# Patient Record
Sex: Female | Born: 1995
Health system: Southern US, Community
[De-identification: ages and names within clinical notes are randomized; demographics above are authoritative.]

## PROBLEM LIST (undated history)

## (undated) ENCOUNTER — Inpatient Hospital Stay (HOSPITAL_COMMUNITY): Payer: Self-pay

## (undated) DIAGNOSIS — Z789 Other specified health status: Secondary | ICD-10-CM

---
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
## 2017-06-04 ENCOUNTER — Encounter (HOSPITAL_COMMUNITY): Payer: Self-pay | Admitting: Emergency Medicine

## 2017-06-04 ENCOUNTER — Ambulatory Visit (HOSPITAL_COMMUNITY)
Admission: EM | Admit: 2017-06-04 | Discharge: 2017-06-04 | Disposition: A | Discharge status: Home / Self Care | Payer: Self-pay | Attending: Family Medicine | Admitting: Family Medicine

## 2017-06-04 DIAGNOSIS — N898 Other specified noninflammatory disorders of vagina: Secondary | ICD-10-CM | POA: Insufficient documentation

## 2017-06-04 DIAGNOSIS — N9089 Other specified noninflammatory disorders of vulva and perineum: Secondary | ICD-10-CM

## 2017-06-04 DIAGNOSIS — R102 Pelvic and perineal pain: Secondary | ICD-10-CM | POA: Insufficient documentation

## 2017-06-04 DIAGNOSIS — Z79899 Other long term (current) drug therapy: Secondary | ICD-10-CM | POA: Insufficient documentation

## 2017-06-04 DIAGNOSIS — R300 Dysuria: Secondary | ICD-10-CM | POA: Insufficient documentation

## 2017-06-04 DIAGNOSIS — Z3202 Encounter for pregnancy test, result negative: Secondary | ICD-10-CM

## 2017-06-04 LAB — POCT URINALYSIS DIP (DEVICE)
BILIRUBIN URINE: NEGATIVE
GLUCOSE, UA: NEGATIVE mg/dL
Ketones, ur: NEGATIVE mg/dL
Nitrite: NEGATIVE
Protein, ur: NEGATIVE mg/dL
SPECIFIC GRAVITY, URINE: 1.025 (ref 1.005–1.030)
Urobilinogen, UA: 0.2 mg/dL (ref 0.0–1.0)
pH: 5.5 (ref 5.0–8.0)

## 2017-06-04 LAB — POCT PREGNANCY, URINE: Preg Test, Ur: NEGATIVE

## 2017-06-04 MED ORDER — MUPIROCIN 2 % EX OINT: 1.0000 "application " | TOPICAL_OINTMENT | Freq: Two times a day (BID) | CUTANEOUS | 0 refills | Status: DC

## 2017-06-04 NOTE — ED Triage Notes (Signed)
Pt sts vaginal irritation since having intercourse 3 days ago

## 2017-06-04 NOTE — ED Provider Notes (Signed)
[facility]    CSN: [id number] Arrival date & time: 06/04/17  1516     History   Chief Complaint Chief Complaint  Patient presents with  . Vaginal Discharge    HPI Pam Jacobson is a 22 y.o. female.   22 year old female comes in for 1 week history of vaginal irritation and pain.  Patient states that started after intercourse, and worries about a tear. She is noticed some vaginal discharge, denies itching.  She states that discharge can be brown in nature, and wonders if her cycle is about to start.  LMP 05/10/2017.  She had some abdominal pain in this past week that resolved on its own.  Denies current abdominal pain, denies nausea, vomiting.  Dysuria without frequency, urgency, hematuria.  Denies fever, chills, night sweats.  Sexually active with one female partner, no condom use.  No birth control use.      History reviewed. No pertinent past medical history.  There are no active problems to display for this patient.   History reviewed. No pertinent surgical history.  OB History    No data available       Home Medications    Prior to Admission medications   Medication Sig Start Date End Date Taking? Authorizing Provider  amoxicillin (AMOXIL) 500 MG capsule Take 1 capsule (500 mg total) by mouth 3 (three) times daily. 11/19/16   Elson AreasSofia, Leslie K, PA-C  mupirocin ointment (BACTROBAN) 2 % Apply 1 application topically 2 (two) times daily. 06/04/17   Belinda FisherYu, Abimbola Aki V, PA-C    Family History History reviewed. No pertinent family history.  Social History Social History   Tobacco Use  . Smoking status: Not on file  Substance Use Topics  . Alcohol use: Not on file  . Drug use: Not on file     Allergies   Patient has no known allergies.   Review of Systems Review of Systems  Reason unable to perform ROS: See HPI as above.     Physical Exam Triage Vital Signs ED Triage Vitals [06/04/17 1543]  Enc Vitals Group     BP 121/72     Pulse Rate 67   Resp 18     Temp 99 F (37.2 C)     Temp Source Oral     SpO2 96 %     Weight      Height      Head Circumference      Peak Flow      Pain Score      Pain Loc      Pain Edu?      Excl. in GC?    No data found.  Updated Vital Signs BP 121/72 (BP Location: Right Arm)   Pulse 67   Temp 99 F (37.2 C) (Oral)   Resp 18   SpO2 96%   Physical Exam  Constitutional: She is oriented to person, place, and time. She appears well-developed and well-nourished. No distress.  HENT:  Head: Normocephalic and atraumatic.  Eyes: Conjunctivae are normal. Pupils are equal, round, and reactive to light.  Cardiovascular: Normal rate, regular rhythm and normal heart sounds. Exam reveals no gallop and no friction rub.  No murmur heard. Pulmonary/Chest: Effort normal and breath sounds normal. She has no wheezes. She has no rales.  Abdominal: Soft. Bowel sounds are normal. She exhibits no mass. There is no tenderness. There is no rebound, no guarding and no CVA tenderness.  Genitourinary:     Genitourinary Comments: Small  tear of the vulva at 6 o'clock without swelling, erythema, increased warmth. Patient unable to tolerate speculum exam due to tear. Vaginal swab obtained for cytology.  No other lesions seen, wound appearance inconsistent with HSV.   Neurological: She is alert and oriented to person, place, and time.  Skin: Skin is warm and dry.  Psychiatric: She has a normal mood and affect. Her behavior is normal. Judgment normal.     UC Treatments / Results  Labs (all labs ordered are listed, but only abnormal results are displayed) Labs Reviewed  POCT URINALYSIS DIP (DEVICE) - Abnormal; Notable for the following components:      Result Value   Hgb urine dipstick MODERATE (*)    Leukocytes, UA SMALL (*)    All other components within normal limits  URINE CULTURE  POCT PREGNANCY, URINE  CERVICOVAGINAL ANCILLARY ONLY    EKG  EKG Interpretation None       Radiology No results  found.  Procedures Procedures (including critical care time)  Medications Ordered in UC Medications - No data to display   Initial Impression / Assessment and Plan / UC Course  I have reviewed the triage vital signs and the nursing notes.  Pertinent labs & imaging results that were available during my care of the patient were reviewed by me and considered in my medical decision making (see chart for details).    Small tear at the 6:00 region of the vulva.  No signs of infection.  Will have patient use Bactroban as needed.  Keep area clean and dry.  Cytology sent.  Urine culture sent.  Return precautions given.  Patient expresses understanding and agrees to plan.  Final Clinical Impressions(s) / UC Diagnoses   Final diagnoses:  Vulvar pain  Vaginal discharge    ED Discharge Orders        Ordered    mupirocin ointment (BACTROBAN) 2 %  2 times daily     06/04/17 1619        Belinda Fisher, PA-C 06/04/17 1623

## 2017-06-04 NOTE — Discharge Instructions (Signed)
You have a small tear at the vulva.  He can apply Bactroban ointment to the affected area.  Keep area clean and dry.  Refrain from any sexual activity until area is healed.  Testing sent, you will be contacted with any positive results.  Any medications needed will be sent and then. Monitor for any worsening of symptoms, fever, abdominal pain, nausea, vomiting, to follow up for reevaluation.

## 2017-06-05 LAB — CERVICOVAGINAL ANCILLARY ONLY
BACTERIAL VAGINITIS: POSITIVE — AB
Candida vaginitis: NEGATIVE
Chlamydia: NEGATIVE
Neisseria Gonorrhea: NEGATIVE
Trichomonas: NEGATIVE

## 2017-06-05 LAB — URINE CULTURE: Culture: NO GROWTH

## 2018-06-14 ENCOUNTER — Emergency Department (HOSPITAL_BASED_OUTPATIENT_CLINIC_OR_DEPARTMENT_OTHER)
Admission: EM | Admit: 2018-06-14 | Discharge: 2018-06-14 | Disposition: A | Discharge status: Home / Self Care | Payer: Self-pay | Attending: Emergency Medicine | Admitting: Emergency Medicine

## 2018-06-14 ENCOUNTER — Other Ambulatory Visit: Payer: Self-pay

## 2018-06-14 ENCOUNTER — Encounter (HOSPITAL_BASED_OUTPATIENT_CLINIC_OR_DEPARTMENT_OTHER): Payer: Self-pay | Admitting: *Deleted

## 2018-06-14 DIAGNOSIS — R102 Pelvic and perineal pain: Secondary | ICD-10-CM | POA: Insufficient documentation

## 2018-06-14 DIAGNOSIS — F17210 Nicotine dependence, cigarettes, uncomplicated: Secondary | ICD-10-CM | POA: Insufficient documentation

## 2018-06-14 LAB — WET PREP, GENITAL
Clue Cells Wet Prep HPF POC: NONE SEEN
SPERM: NONE SEEN
Trich, Wet Prep: NONE SEEN
Yeast Wet Prep HPF POC: NONE SEEN

## 2018-06-14 LAB — URINALYSIS, MICROSCOPIC (REFLEX)

## 2018-06-14 LAB — URINALYSIS, ROUTINE W REFLEX MICROSCOPIC
Bilirubin Urine: NEGATIVE
Glucose, UA: NEGATIVE mg/dL
KETONES UR: NEGATIVE mg/dL
Leukocytes,Ua: NEGATIVE
Nitrite: NEGATIVE
PROTEIN: NEGATIVE mg/dL
Specific Gravity, Urine: 1.025 (ref 1.005–1.030)
pH: 6 (ref 5.0–8.0)

## 2018-06-14 LAB — COMPREHENSIVE METABOLIC PANEL
ALK PHOS: 52 U/L (ref 38–126)
ALT: 14 U/L (ref 0–44)
AST: 25 U/L (ref 15–41)
Albumin: 5.1 g/dL — ABNORMAL HIGH (ref 3.5–5.0)
Anion gap: 10 (ref 5–15)
BUN: 13 mg/dL (ref 6–20)
CO2: 22 mmol/L (ref 22–32)
Calcium: 9.7 mg/dL (ref 8.9–10.3)
Chloride: 103 mmol/L (ref 98–111)
Creatinine, Ser: 0.86 mg/dL (ref 0.44–1.00)
GFR calc Af Amer: >60 mL/min (ref >60)
GFR calc non Af Amer: >60 mL/min (ref >60)
GLUCOSE: 107 mg/dL — AB (ref 70–99)
Potassium: 3.2 mmol/L — ABNORMAL LOW (ref 3.5–5.1)
SODIUM: 135 mmol/L (ref 135–145)
TOTAL PROTEIN: 8.9 g/dL — AB (ref 6.5–8.1)
Total Bilirubin: 0.6 mg/dL (ref 0.3–1.2)

## 2018-06-14 LAB — CBC WITH DIFFERENTIAL/PLATELET
ABS IMMATURE GRANULOCYTES: 0.02 10*3/uL (ref 0.00–0.07)
Basophils Absolute: 0.1 10*3/uL (ref 0.0–0.1)
Basophils Relative: 1 %
EOS ABS: 0.1 10*3/uL (ref 0.0–0.5)
Eosinophils Relative: 1 %
HEMATOCRIT: 41.1 % (ref 36.0–46.0)
HEMOGLOBIN: 12.9 g/dL (ref 12.0–15.0)
IMMATURE GRANULOCYTES: 0 %
LYMPHS ABS: 2.1 10*3/uL (ref 0.7–4.0)
LYMPHS PCT: 25 %
MCH: 27.7 pg (ref 26.0–34.0)
MCHC: 31.4 g/dL (ref 30.0–36.0)
MCV: 88.2 fL (ref 80.0–100.0)
MONOS PCT: 9 %
Monocytes Absolute: 0.8 10*3/uL (ref 0.1–1.0)
NEUTROS PCT: 64 %
Neutro Abs: 5.4 10*3/uL (ref 1.7–7.7)
Platelets: 363 10*3/uL (ref 150–400)
RBC: 4.66 MIL/uL (ref 3.87–5.11)
RDW: 12.6 % (ref 11.5–15.5)
WBC: 8.4 10*3/uL (ref 4.0–10.5)
nRBC: 0 % (ref 0.0–0.2)

## 2018-06-14 LAB — LIPASE, BLOOD: Lipase: 42 U/L (ref 11–51)

## 2018-06-14 LAB — PREGNANCY, URINE: Preg Test, Ur: NEGATIVE

## 2018-06-14 MED ORDER — ONDANSETRON 4 MG PO TBDP: 4.0000 mg | ORAL_TABLET | Freq: Three times a day (TID) | ORAL | 0 refills | Status: DC | PRN

## 2018-06-14 MED ORDER — ONDANSETRON HCL 4 MG/2ML IJ SOLN
4.0000 mg | Freq: Once | INTRAMUSCULAR | Status: AC
  Administered 2018-06-14: 4 mg via INTRAVENOUS
  Filled 2018-06-14: qty 2

## 2018-06-14 MED ORDER — ACETAMINOPHEN 325 MG PO TABS
650.0000 mg | ORAL_TABLET | Freq: Once | ORAL | Status: AC
  Administered 2018-06-14: 650 mg via ORAL
  Filled 2018-06-14: qty 2

## 2018-06-14 NOTE — ED Provider Notes (Signed)
[facility] EMERGENCY DEPARTMENT Provider Note   CSN: [id number] Arrival date & time: 06/14/18  1948    History   Chief Complaint Chief Complaint  Patient presents with  . Abdominal Pain    HPI Pam Jacobson is a 23 y.o. female who presents today for evaluation of lower abdominal pain.  She reports nausea and has vomited once today.  She denies any diarrhea or constipation.  She says that she started her period today and it feels much more painful than usual.  She says that she is sexually active with her boyfriend.  She denies any vaginal discharge.  No dysuria, increased frequency or urgency.  She denies any fevers.  She has tried Aleve at home without significant relief.     HPI  History reviewed. No pertinent past medical history.  There are no active problems to display for this patient.   History reviewed. No pertinent surgical history.   OB History   No obstetric history on file.      Home Medications    Prior to Admission medications   Medication Sig Start Date End Date Taking? Authorizing Provider  amoxicillin (AMOXIL) 500 MG capsule Take 1 capsule (500 mg total) by mouth 3 (three) times daily. 11/19/16   Elson Areas, PA-C  mupirocin ointment (BACTROBAN) 2 % Apply 1 application topically 2 (two) times daily. 06/04/17   Cathie Hoops, Amy V, PA-C  ondansetron (ZOFRAN ODT) 4 MG disintegrating tablet Take 1 tablet (4 mg total) by mouth every 8 (eight) hours as needed for nausea or vomiting. 06/14/18   Cristina Gong, PA-C    Family History No family history on file.  Social History Social History   Tobacco Use  . Smoking status: Current Every Day Smoker    Types: Cigarettes  . Smokeless tobacco: Never Used  Substance Use Topics  . Alcohol use: Never    Frequency: Never  . Drug use: Never     Allergies   Ibuprofen   Review of Systems Review of Systems  Constitutional: Negative for chills and fever.  Respiratory: Negative for chest  tightness and shortness of breath.   Cardiovascular: Negative for chest pain.  Gastrointestinal: Positive for abdominal pain, nausea and vomiting. Negative for constipation and diarrhea.  Genitourinary: Positive for pelvic pain and vaginal bleeding. Negative for dysuria, flank pain, frequency, genital sores, hematuria, menstrual problem, vaginal discharge and vaginal pain.  Musculoskeletal: Negative for back pain and neck pain.  Neurological: Negative for weakness.  Hematological: Does not bruise/bleed easily.  All other systems reviewed and are negative.    Physical Exam Updated Vital Signs BP 114/72   Pulse 60   Temp 98.6 F (37 C) (Oral)   Resp 18   Ht 5\' 3"  (1.6 m)   Wt 68 kg   LMP 06/14/2018   SpO2 100%   BMI 26.57 kg/m   Physical Exam Vitals signs and nursing note reviewed. Exam conducted with a chaperone present.  Constitutional:      General: She is not in acute distress.    Appearance: She is well-developed. She is not diaphoretic.  HENT:     Head: Normocephalic and atraumatic.     Mouth/Throat:     Mouth: Mucous membranes are moist.  Eyes:     General: No scleral icterus.       Right eye: No discharge.        Left eye: No discharge.     Conjunctiva/sclera: Conjunctivae normal.  Neck:  Musculoskeletal: Normal range of motion.  Cardiovascular:     Rate and Rhythm: Normal rate and regular rhythm.  Pulmonary:     Effort: Pulmonary effort is normal. No respiratory distress.     Breath sounds: No stridor.  Abdominal:     General: Bowel sounds are normal. There is no distension.     Palpations: Abdomen is soft.     Tenderness: There is abdominal tenderness in the suprapubic area. There is no right CVA tenderness, left CVA tenderness, guarding or rebound.     Hernia: No hernia is present.  Genitourinary:    Vagina: No foreign body. Bleeding (from cervical os) present. No erythema.     Cervix: No cervical motion tenderness, discharge or friability.      Uterus: Normal.      Adnexa: Right adnexa normal and left adnexa normal.       Right: No mass, tenderness or fullness.         Left: No mass, tenderness or fullness.    Musculoskeletal:        General: No deformity.  Skin:    General: Skin is warm and dry.  Neurological:     General: No focal deficit present.     Mental Status: She is alert.     Motor: No abnormal muscle tone.  Psychiatric:        Behavior: Behavior normal.      ED Treatments / Results  Labs (all labs ordered are listed, but only abnormal results are displayed) Labs Reviewed  WET PREP, GENITAL - Abnormal; Notable for the following components:      Result Value   WBC, Wet Prep HPF POC MODERATE (*)    All other components within normal limits  COMPREHENSIVE METABOLIC PANEL - Abnormal; Notable for the following components:   Potassium 3.2 (*)    Glucose, Bld 107 (*)    Total Protein 8.9 (*)    Albumin 5.1 (*)    All other components within normal limits  URINALYSIS, ROUTINE W REFLEX MICROSCOPIC - Abnormal; Notable for the following components:   APPearance HAZY (*)    Hgb urine dipstick LARGE (*)    All other components within normal limits  URINALYSIS, MICROSCOPIC (REFLEX) - Abnormal; Notable for the following components:   Bacteria, UA FEW (*)    All other components within normal limits  URINE CULTURE  PREGNANCY, URINE  LIPASE, BLOOD  CBC WITH DIFFERENTIAL/PLATELET  GC/CHLAMYDIA PROBE AMP ([place]    EKG None  Radiology No results found.  Procedures Procedures (including critical care time)  Medications Ordered in ED Medications  acetaminophen (TYLENOL) tablet 650 mg (650 mg Oral Given 06/14/18 2044)  ondansetron (ZOFRAN) injection 4 mg (4 mg Intravenous Given 06/14/18 2204)     Initial Impression / Assessment and Plan / ED Course  I have reviewed the triage vital signs and the nursing notes.  Pertinent labs & imaging results that were available during my care of the  patient were reviewed by me and considered in my medical decision making (see chart for details).       Pam Jacobson presents today for evaluation of pelvic pain.  She started her menstrual cycle today and reports that this pain feels similar however is much worse than it has been previously.  She does have nausea and vomited once.  Labs are obtained and reviewed, she does not have a significant leukocytosis or anemia.  She is hemodynamically stable while in the emergency  room and afebrile.  Pelvic exam was performed without abnormalities noted.  GC testing was sent.  Her pain is in the middle of her abdomen and not localized to the right lower quadrant.  Given the midline nature of her pain, normal white count and pain improved with Tylenol and heat in addition to pain correlating with the onset of her menstrual cycle do not suspect appendicitis.  Urine does not show evidence of infection.  She is not pregnant, her pain is not severe enough and she does not have adnexal tenderness so do not suspect ovarian torsion, TOA or ectopic.    She is given OB/GYN follow-up as an outpatient.  Recommended conservative care including heat, Tylenol, and Aleve.  No indication for emergent ultrasound at this time.    Return precautions were discussed with patient who states their understanding.  At the time of discharge patient denied any unaddressed complaints or concerns.  Patient is agreeable for discharge home.   Final Clinical Impressions(s) / ED Diagnoses   Final diagnoses:  Pelvic pain in female    ED Discharge Orders         Ordered    ondansetron (ZOFRAN ODT) 4 MG disintegrating tablet  Every 8 hours PRN     06/14/18 2209           Cristina Gong, [place] 06/14/18 2252    Sabas Sous, MD 06/14/18 [phone]

## 2018-06-14 NOTE — Discharge Instructions (Addendum)
Please take Tylenol (acetaminophen) to relieve your pain.  You may take tylenol, up to 1,000 mg (two extra strength pills).  Do not take more than 3,000 mg tylenol in a 24 hour period.  Please check all medication labels as many medications such as pain and cold medications may contain tylenol. Please do not drink alcohol while taking this medication.   Your urine does not look infected today.  As we discussed you have test for gonorrhea and chlamydia pending, if they are positive you should receive a phone call in the next 2 to 3 days.  No news is good news.  I have given you follow-up with the [facility] outpatient clinic, please make an appointment to see them.  If you develop localized pain on the right side of your lower stomach, you develop fevers or have new or concerning symptoms please seek repeat medical care and evaluation.

## 2018-06-14 NOTE — ED Triage Notes (Signed)
Pt c/o lower abd pain today; endorses nausea; vomited x 1;  She started her period today.

## 2018-06-16 LAB — URINE CULTURE

## 2018-06-16 LAB — GC/CHLAMYDIA PROBE AMP (~~LOC~~) NOT AT ARMC
CHLAMYDIA, DNA PROBE: NEGATIVE
NEISSERIA GONORRHEA: NEGATIVE

## 2018-12-15 ENCOUNTER — Encounter (HOSPITAL_COMMUNITY): Payer: Self-pay | Admitting: *Deleted

## 2018-12-15 ENCOUNTER — Inpatient Hospital Stay (HOSPITAL_COMMUNITY)
Admission: AD | Admit: 2018-12-15 | Discharge: 2018-12-15 | Disposition: A | Discharge status: Home / Self Care | Payer: Medicaid Other | Attending: Obstetrics & Gynecology | Admitting: Obstetrics & Gynecology

## 2018-12-15 ENCOUNTER — Inpatient Hospital Stay (HOSPITAL_COMMUNITY): Payer: Medicaid Other

## 2018-12-15 ENCOUNTER — Other Ambulatory Visit: Payer: Self-pay

## 2018-12-15 DIAGNOSIS — O26891 Other specified pregnancy related conditions, first trimester: Secondary | ICD-10-CM | POA: Insufficient documentation

## 2018-12-15 DIAGNOSIS — R109 Unspecified abdominal pain: Secondary | ICD-10-CM | POA: Insufficient documentation

## 2018-12-15 DIAGNOSIS — Z3A01 Less than 8 weeks gestation of pregnancy: Secondary | ICD-10-CM | POA: Insufficient documentation

## 2018-12-15 DIAGNOSIS — Z7722 Contact with and (suspected) exposure to environmental tobacco smoke (acute) (chronic): Secondary | ICD-10-CM | POA: Diagnosis not present

## 2018-12-15 DIAGNOSIS — O219 Vomiting of pregnancy, unspecified: Secondary | ICD-10-CM | POA: Diagnosis not present

## 2018-12-15 DIAGNOSIS — Z3491 Encounter for supervision of normal pregnancy, unspecified, first trimester: Secondary | ICD-10-CM

## 2018-12-15 HISTORY — DX: Other specified health status: Z78.9

## 2018-12-15 LAB — URINALYSIS, ROUTINE W REFLEX MICROSCOPIC
Bacteria, UA: NONE SEEN
Bilirubin Urine: NEGATIVE
Glucose, UA: NEGATIVE mg/dL
Hgb urine dipstick: NEGATIVE
Ketones, ur: 80 mg/dL — AB
Leukocytes,Ua: NEGATIVE
Nitrite: NEGATIVE
Protein, ur: 30 mg/dL — AB
Specific Gravity, Urine: 1.025 (ref 1.005–1.030)
pH: 6 (ref 5.0–8.0)

## 2018-12-15 LAB — ABO/RH: ABO/RH(D): O POS

## 2018-12-15 LAB — CBC
HCT: 38.7 % (ref 36.0–46.0)
Hemoglobin: 13.2 g/dL (ref 12.0–15.0)
MCH: 29.6 pg (ref 26.0–34.0)
MCHC: 34.1 g/dL (ref 30.0–36.0)
MCV: 86.8 fL (ref 80.0–100.0)
Platelets: 356 10*3/uL (ref 150–400)
RBC: 4.46 MIL/uL (ref 3.87–5.11)
RDW: 12.4 % (ref 11.5–15.5)
WBC: 6.6 10*3/uL (ref 4.0–10.5)
nRBC: 0 % (ref 0.0–0.2)

## 2018-12-15 LAB — WET PREP, GENITAL
Clue Cells Wet Prep HPF POC: NONE SEEN
Sperm: NONE SEEN
Trich, Wet Prep: NONE SEEN
Yeast Wet Prep HPF POC: NONE SEEN

## 2018-12-15 LAB — POCT PREGNANCY, URINE: Preg Test, Ur: POSITIVE — AB

## 2018-12-15 LAB — HCG, QUANTITATIVE, PREGNANCY: hCG, Beta Chain, Quant, S: 58696 m[IU]/mL — ABNORMAL HIGH (ref <5)

## 2018-12-15 MED ORDER — FAMOTIDINE IN NACL 20-0.9 MG/50ML-% IV SOLN
20.0000 mg | Freq: Once | INTRAVENOUS | Status: AC
  Administered 2018-12-15: 17:00:00 20 mg via INTRAVENOUS
  Filled 2018-12-15: qty 50

## 2018-12-15 MED ORDER — PROMETHAZINE HCL 25 MG/ML IJ SOLN
25.0000 mg | Freq: Once | INTRAMUSCULAR | Status: AC
  Administered 2018-12-15: 25 mg via INTRAVENOUS
  Filled 2018-12-15: qty 1

## 2018-12-15 MED ORDER — PROMETHAZINE HCL 25 MG PO TABS: 25.0000 mg | ORAL_TABLET | Freq: Four times a day (QID) | ORAL | 0 refills | Status: DC | PRN

## 2018-12-15 MED ORDER — LACTATED RINGERS IV BOLUS
1000.0000 mL | Freq: Once | INTRAVENOUS | Status: AC
  Administered 2018-12-15: 1000 mL via INTRAVENOUS

## 2018-12-15 NOTE — MAU Note (Signed)
Having really bad abd pain, for about 2 wks.  Sat or Sun, couldn't hold down anything, not food or fluids, still having that problem. Feels really tired.  Pt is preg, has not been confirmed yet.Marland Kitchen

## 2018-12-15 NOTE — MAU Provider Note (Signed)
Chief Complaint: Abdominal Pain, Emesis, Nausea, and Possible Pregnancy   First Provider Initiated Contact with Patient 12/15/18 1535     SUBJECTIVE HPI: Pam Jacobson is a 23 y.o. G1P0 at [redacted]w[redacted]d who presents to Maternity Admissions reporting n/v & abdominal pain. Reports vomiting for the last week. Was vomiting 2 x per day but increased to 4x per day since yesterday. Has not treated symptoms. States she can't keep down food or water.  Also reports lower abdominal cramping. Pain was intermittent but has been more constant for the last few days. Denies fever/chills, diarrhea, constipation, dysuria, vaginal discharge, or vaginal bleeding.   Location: abdomen Quality: cramping Severity: 8/10 on pain scale Duration: 1 week Timing: constant Modifying factors: none Associated signs and symptoms: n/v  Past Medical History:  Diagnosis Date  . Medical history non-contributory    OB History  Gravida Para Term Preterm AB Living  1            SAB TAB Ectopic Multiple Live Births               # Outcome Date GA Lbr Len/2nd Weight Sex Delivery Anes PTL Lv  1 Current            Past Surgical History:  Procedure Laterality Date  . NO PAST SURGERIES     Social History   Socioeconomic History  . Marital status: Significant Other    Spouse name: Not on file  . Number of children: Not on file  . Years of education: Not on file  . Highest education level: Not on file  Occupational History  . Not on file  Social Needs  . Financial resource strain: Not on file  . Food insecurity    Worry: Not on file    Inability: Not on file  . Transportation needs    Medical: Not on file    Non-medical: Not on file  Tobacco Use  . Smoking status: Passive Smoke Exposure - Never Smoker  . Smokeless tobacco: Never Used  Substance and Sexual Activity  . Alcohol use: Never    Frequency: Never  . Drug use: Never  . Sexual activity: Not on file  Lifestyle  . Physical activity    Days per week: Not  on file    Minutes per session: Not on file  . Stress: Not on file  Relationships  . Social Musician on phone: Not on file    Gets together: Not on file    Attends religious service: Not on file    Active member of club or organization: Not on file    Attends meetings of clubs or organizations: Not on file    Relationship status: Not on file  . Intimate partner violence    Fear of current or ex partner: Not on file    Emotionally abused: Not on file    Physically abused: Not on file    Forced sexual activity: Not on file  Other Topics Concern  . Not on file  Social History Narrative  . Not on file   History reviewed. No pertinent family history. No current facility-administered medications on file prior to encounter.    Current Outpatient Medications on File Prior to Encounter  Medication Sig Dispense Refill  . amoxicillin (AMOXIL) 500 MG capsule Take 1 capsule (500 mg total) by mouth 3 (three) times daily. 30 capsule 0  . mupirocin ointment (BACTROBAN) 2 % Apply 1 application topically 2 (two) times daily. 22  g 0  . ondansetron (ZOFRAN ODT) 4 MG disintegrating tablet Take 1 tablet (4 mg total) by mouth every 8 (eight) hours as needed for nausea or vomiting. 10 tablet 0   Allergies  Allergen Reactions  . Ibuprofen Itching and Nausea And Vomiting    I have reviewed patient's Past Medical Hx, Surgical Hx, Family Hx, Social Hx, medications and allergies.   Review of Systems  Constitutional: Negative.   Gastrointestinal: Positive for abdominal pain, nausea and vomiting. Negative for constipation and diarrhea.  Genitourinary: Negative.     OBJECTIVE Patient Vitals for the past 24 hrs:  BP Temp Temp src Pulse Resp SpO2 Height Weight  12/15/18 1447 112/72 98.7 F (37.1 C) Oral 63 16 100 %  (1.6 m) 57.8 kg   Constitutional: Well-developed, well-nourished female in no acute distress.  Cardiovascular: normal rate & rhythm, no murmur Respiratory: normal rate  and effort. Lung sounds clear throughout GI: Abd soft, non-tender, Pos BS x 4. No guarding or rebound tenderness MS: Extremities nontender, no edema, normal ROM Neurologic: Alert and oriented x 4.     LAB RESULTS Results for orders placed or performed during the hospital encounter of 12/15/18 (from the past 24 hour(s))  Pregnancy, urine POC     Status: Abnormal   Collection Time: 12/15/18  2:58 PM  Result Value Ref Range   Preg Test, Ur POSITIVE (A) NEGATIVE  Urinalysis, Routine w reflex microscopic     Status: Abnormal   Collection Time: 12/15/18  3:13 PM  Result Value Ref Range   Color, Urine YELLOW YELLOW   APPearance HAZY (A) CLEAR   Specific Gravity, Urine 1.025 1.005 - 1.030   pH 6.0 5.0 - 8.0   Glucose, UA NEGATIVE NEGATIVE mg/dL   Hgb urine dipstick NEGATIVE NEGATIVE   Bilirubin Urine NEGATIVE NEGATIVE   Ketones, ur 80 (A) NEGATIVE mg/dL   Protein, ur 30 (A) NEGATIVE mg/dL   Nitrite NEGATIVE NEGATIVE   Leukocytes,Ua NEGATIVE NEGATIVE   RBC / HPF 0-5 0 - 5 RBC/hpf   WBC, UA 0-5 0 - 5 WBC/hpf   Bacteria, UA NONE SEEN NONE SEEN   Squamous Epithelial / LPF 0-5 0 - 5   Mucus PRESENT   Wet prep, genital     Status: Abnormal   Collection Time: 12/15/18  4:05 PM   Specimen: Vaginal  Result Value Ref Range   Yeast Wet Prep HPF POC NONE SEEN NONE SEEN   Trich, Wet Prep NONE SEEN NONE SEEN   Clue Cells Wet Prep HPF POC NONE SEEN NONE SEEN   WBC, Wet Prep HPF POC MODERATE (A) NONE SEEN   Sperm NONE SEEN   CBC     Status: None   Collection Time: 12/15/18  4:31 PM  Result Value Ref Range   WBC 6.6 4.0 - 10.5 K/uL   RBC 4.46 3.87 - 5.11 MIL/uL   Hemoglobin 13.2 12.0 - 15.0 g/dL   HCT 21.3 08.6 - 57.8 %   MCV 86.8 80.0 - 100.0 fL   MCH 29.6 26.0 - 34.0 pg   MCHC 34.1 30.0 - 36.0 g/dL   RDW 46.9 62.9 - 52.8 %   Platelets 356 150 - 400 K/uL   nRBC 0.0 0.0 - 0.2 %  ABO/Rh     Status: None   Collection Time: 12/15/18  4:31 PM  Result Value Ref Range   ABO/RH(D) O POS     No rh immune globuloin      NOT A RH  IMMUNE GLOBULIN CANDIDATE, PT RH POSITIVE Performed at [facility], [place] [address] [place],  [address]   hCG, quantitative, pregnancy     Status: Abnormal   Collection Time: 12/15/18  4:31 PM  Result Value Ref Range   hCG, Beta Chain, Quant, S 58,696 (H) <5 mIU/mL    IMAGING US Ob Less Than 14 Weeks With Ob Transvaginal  Result Date: 12/15/2018 CLINICAL DATA:  Pregnant patient with abdominal pain. EXAM: OBSTETRIC <14 WK [place] AND TRANSVAGINAL OB US TECHNIQUE: Both transabdominal and transvaginal ultrasound examinations were performed for complete evaluation of the gestation as well as the maternal uterus, adnexal regions, and pelvic cul-de-sac. Transvaginal technique was performed to assess early pregnancy. COMPARISON:  None. FINDINGS: Intrauterine gestational sac: Single Yolk sac:  Visualized. Embryo:  Visualized. Cardiac Activity: Visualized. Heart Rate: 114 bpm CRL:  5.7 mm   6 w   2 d                  [place] EDC: 08/08/2019 Subchorionic hemorrhage: Adjacent to the right aspect of the gestational sac, within the endometrial canal, is a 1.2 x 0.6 x 1.5 cm fluid collection. Maternal uterus/adnexae: Normal right and left ovaries. Small amount of free fluid in the pelvis. IMPRESSION: Single live intrauterine gestation. Adjacent to the gestational sac, within the endometrium, is a small focal area of fluid. This may represent an evolving adjacent subchorionic hemorrhage or potentially an additional adjacent gestational sac without yolk sac. Recommend attention on follow-up pelvic ultrasound in 10-14 days for definitive characterization. Electronically Signed   By: Lovey Newcomer M.D.   On: 12/15/2018 18:48    MAU COURSE Orders Placed This Encounter  Procedures  . Wet prep, genital  . US OB LESS THAN 14 WEEKS WITH OB TRANSVAGINAL  . US OB Transvaginal  . Urinalysis, Routine w reflex microscopic  . CBC  . hCG, quantitative, pregnancy  . HIV Antibody  (routine testing w rflx)  . Pregnancy, urine POC  . ABO/Rh  . Discharge patient   Meds ordered this encounter  Medications  . lactated ringers bolus 1,000 mL  . promethazine (PHENERGAN) injection 25 mg  . famotidine (PEPCID) IVPB 20 mg premix  . promethazine (PHENERGAN) 25 MG tablet    Sig: Take 1 tablet (25 mg total) by mouth every 6 (six) hours as needed for nausea or vomiting.    Dispense:  30 tablet    Refill:  0    Order Specific Question:   Supervising Provider    Answer:   CONSTANT, PEGGY [[id number]]    MDM +UPT UA, wet prep, GC/chlamydia, CBC, ABO/Rh, quant hCG, and [place] today to rule out ectopic pregnancy  U/a indicates dehydration. IV fluid bolus & meds given. Pt not vomiting & reports improvement in symptoms.   Ultrasound shows live IUP. Also shows "focal area of fluid" next to sac that could be second gestational sac or [facility], recommends f/u ultrasound.   ASSESSMENT 1. Normal IUP (intrauterine pregnancy) on prenatal ultrasound, first trimester   2. Abdominal pain during pregnancy in first trimester   3. Nausea and vomiting during pregnancy prior to [redacted] weeks gestation     PLAN Discharge home in stable condition. Discussed reasons to return to MAU Rx phenergan GC/CT pending Outpatient ultrasound ordered in 2 weeks to reassess fluid area Pt to call [facility] for prenatal care  Follow-up Information    [facility] Follow up.   Specialty: Obstetrics and Gynecology Why: call to schedule  prenatal care  Contact information: [address] [place] [id number] [phone]       [facility].   Specialty: Radiology Why: They will call you to schedule your ultrasound appointment Contact information: [address], [address] [id number] [place] [phone]       [facility] Follow up.   Specialty: Obstetrics and  Gynecology Why: return for worsening symptoms Contact information: [address] [id number] [place] [id number] [phone]         Allergies as of 12/15/2018      Reactions   Ibuprofen Itching, Nausea And Vomiting      Medication List    STOP taking these medications   amoxicillin 500 MG capsule Commonly known as: AMOXIL   mupirocin ointment 2 % Commonly known as: Bactroban   ondansetron 4 MG disintegrating tablet Commonly known as: Zofran ODT     TAKE these medications   promethazine 25 MG tablet Commonly known as: PHENERGAN Take 1 tablet (25 mg total) by mouth every 6 (six) hours as needed for nausea or vomiting.        Judeth HornLawrence, Glendell Fouse, NP 12/15/2018  7:11 PM

## 2018-12-15 NOTE — Discharge Instructions (Signed)
Morning Sickness  Morning sickness is when a woman feels nauseous during pregnancy. This nauseous feeling may or may not come with vomiting. It often occurs in the morning, but it can be a problem at any time of day. Morning sickness is most common during the first trimester. In some cases, it may continue throughout pregnancy. Although morning sickness is unpleasant, it is usually harmless unless the woman develops severe and continual vomiting (hyperemesis gravidarum), a condition that requires more intense treatment. What are the causes? The exact cause of this condition is not known, but it seems to be related to normal hormonal changes that occur in pregnancy. What increases the risk? You are more likely to develop this condition if:  You experienced nausea or vomiting before your pregnancy.  You had morning sickness during a previous pregnancy.  You are pregnant with more than one baby, such as twins. What are the signs or symptoms? Symptoms of this condition include:  Nausea.  Vomiting. How is this diagnosed? This condition is usually diagnosed based on your signs and symptoms. How is this treated? In many cases, treatment is not needed for this condition. Making some changes to what you eat may help to control symptoms. Your health care provider may also prescribe or recommend:  Vitamin B6 supplements.  Anti-nausea medicines.  Ginger. Follow these instructions at home: Medicines  Take over-the-counter and prescription medicines only as told by your health care provider. Do not use any prescription, over-the-counter, or herbal medicines for morning sickness without first talking with your health care provider.  Taking multivitamins before getting pregnant can prevent or decrease the severity of morning sickness in most women. Eating and drinking  Eat a piece of dry toast or crackers before getting out of bed in the morning.  Eat 5 or 6 small meals a day.  Eat dry and  bland foods, such as rice or a baked potato. Foods that are high in carbohydrates are often helpful.  Avoid greasy, fatty, and spicy foods.  Have someone cook for you if the smell of any food causes nausea and vomiting.  If you feel nauseous after taking prenatal vitamins, take the vitamins at night or with a snack.  Snack on protein foods between meals if you are hungry. Nuts, yogurt, and cheese are good options.  Drink fluids throughout the day.  Try ginger ale made with real ginger, ginger tea made from fresh grated ginger, or ginger candies. General instructions  Do not use any products that contain nicotine or tobacco, such as cigarettes and e-cigarettes. If you need help quitting, ask your health care provider.  Get an air purifier to keep the air in your house free of odors.  Get plenty of fresh air.  Try to avoid odors that trigger your nausea.  Consider trying these methods to help relieve symptoms: ? Wearing an acupressure wristband. These wristbands are often worn for seasickness. ? Acupuncture. Contact a health care provider if:  Your home remedies are not working and you need medicine.  You feel dizzy or light-headed.  You are losing weight. Get help right away if:  You have persistent and uncontrolled nausea and vomiting.  You faint.  You have severe pain in your abdomen. Summary  Morning sickness is when a woman feels nauseous during pregnancy. This nauseous feeling may or may not come with vomiting.  Morning sickness is most common during the first trimester.  It often occurs in the morning, but it can be a problem at  any time of day.  In many cases, treatment is not needed for this condition. Making some changes to what you eat may help to control symptoms. This information is not intended to replace advice given to you by your health care provider. Make sure you discuss any questions you have with your health care provider. Document Released:  04/26/2006 Document Revised: 02/15/2017 Document Reviewed: 04/07/2016 Elsevier Patient Education  [place].    Safe Medications in Pregnancy   Acne: Benzoyl Peroxide Salicylic Acid  Backache/Headache: Tylenol: 2 regular strength every 4 hours OR              2 Extra strength every 6 hours  Colds/Coughs/Allergies: Benadryl (alcohol free) 25 mg every 6 hours as needed Breath right strips Claritin Cepacol throat lozenges Chloraseptic throat spray Cold-Eeze- up to three times per day Cough drops, alcohol free Flonase (by prescription only) Guaifenesin Mucinex Robitussin DM (plain only, alcohol free) Saline nasal spray/drops Sudafed (pseudoephedrine) & Actifed ** use only after [redacted] weeks gestation and if you do not have high blood pressure Tylenol Vicks Vaporub Zinc lozenges Zyrtec   Constipation: Colace Ducolax suppositories Fleet enema Glycerin suppositories Metamucil Milk of magnesia Miralax Senokot Smooth move tea  Diarrhea: Kaopectate Imodium A-D  *NO pepto Bismol  Hemorrhoids: Anusol Anusol HC Preparation H Tucks  Indigestion: Tums Maalox Mylanta Zantac  Pepcid  Insomnia: Benadryl (alcohol free) 25mg  every 6 hours as needed Tylenol PM Unisom, no Gelcaps  Leg Cramps: Tums MagGel  Nausea/Vomiting:  Bonine Dramamine Emetrol Ginger extract Sea bands Meclizine  Nausea medication to take during pregnancy:  Unisom (doxylamine succinate 25 mg tablets) Take one tablet daily at bedtime. If symptoms are not adequately controlled, the dose can be increased to a maximum recommended dose of two tablets daily (1/2 tablet in the morning, 1/2 tablet mid-afternoon and one at bedtime). Vitamin B6 100mg  tablets. Take one tablet twice a day (up to 200 mg per day).  Skin Rashes: Aveeno products Benadryl cream or 25mg  every 6 hours as needed Calamine Lotion 1% cortisone cream  Yeast infection: Gyne-lotrimin 7 Monistat 7  Gum/tooth  pain: Anbesol  **If taking multiple medications, please check labels to avoid duplicating the same active ingredients **take medication as directed on the label ** Do not exceed 4000 mg of tylenol in 24 hours **Do not take medications that contain aspirin or ibuprofen

## 2018-12-15 NOTE — Progress Notes (Addendum)
Wet prep & GC/Chlamydia cultures obtained by this RN. 

## 2018-12-16 ENCOUNTER — Ambulatory Visit: Payer: Self-pay

## 2018-12-16 LAB — GC/CHLAMYDIA PROBE AMP (~~LOC~~) NOT AT ARMC
Chlamydia: NEGATIVE
Molecular Disclaimer: NEGATIVE
Molecular Disclaimer: NORMAL
Neisseria Gonorrhea: NEGATIVE

## 2018-12-16 LAB — HIV ANTIBODY (ROUTINE TESTING W REFLEX): HIV Screen 4th Generation wRfx: NONREACTIVE

## 2018-12-17 ENCOUNTER — Other Ambulatory Visit: Payer: Self-pay | Admitting: Student

## 2018-12-17 ENCOUNTER — Telehealth: Payer: Self-pay | Admitting: *Deleted

## 2018-12-17 DIAGNOSIS — O219 Vomiting of pregnancy, unspecified: Secondary | ICD-10-CM

## 2018-12-17 MED ORDER — PROMETHAZINE HCL 6.25 MG/5ML PO SYRP: 12.5000 mg | ORAL_SOLUTION | Freq: Four times a day (QID) | ORAL | 1 refills | Status: DC | PRN

## 2018-12-17 NOTE — Telephone Encounter (Signed)
Patient called stating she is need Rx for phenergan changed from tablet form to liquid form. She has trouble with tablets. Advised patient that nurse does not have standing orders to change Rx from tablets to liquid. Will forward to prescribing provider.  Derl Barrow, RN

## 2018-12-29 ENCOUNTER — Ambulatory Visit (INDEPENDENT_AMBULATORY_CARE_PROVIDER_SITE_OTHER): Payer: Self-pay

## 2018-12-29 ENCOUNTER — Ambulatory Visit (HOSPITAL_COMMUNITY)
Admission: RE | Admit: 2018-12-29 | Discharge: 2018-12-29 | Disposition: A | Discharge status: Home / Self Care | Payer: Medicaid Other | Source: Ambulatory Visit | Attending: Student | Admitting: Student

## 2018-12-29 ENCOUNTER — Other Ambulatory Visit: Payer: Self-pay

## 2018-12-29 DIAGNOSIS — Z3491 Encounter for supervision of normal pregnancy, unspecified, first trimester: Secondary | ICD-10-CM

## 2018-12-29 DIAGNOSIS — O26891 Other specified pregnancy related conditions, first trimester: Secondary | ICD-10-CM | POA: Diagnosis present

## 2018-12-29 DIAGNOSIS — R109 Unspecified abdominal pain: Secondary | ICD-10-CM | POA: Diagnosis present

## 2018-12-29 MED ORDER — PREPLUS 27-1 MG PO TABS: 1.0000 | ORAL_TABLET | Freq: Every day | ORAL | 11 refills | Status: DC

## 2018-12-29 NOTE — Progress Notes (Signed)
Pt here today for [place] results following appt with MFM. Results reviewed by Nehemiah Settle, DO who finds a single, living IUP. Pt is 8w 4d today with EDD of 08/06/19. Provider recommends pt begin prenatal care.   Results and pictures provided to pt. Dating reviewed. Reviewed pt's upcoming appt with [facility] to begin prenatal care. Medications and allergies reviewed with pt; pt given list of medications safe to take during pregnancy. Prenatal vitamin rx sent to patient's preferred pharmacy. Pt has no further questions or concerns at this time.  Apolonio Schneiders RN 12/29/18

## 2018-12-30 NOTE — Progress Notes (Signed)
Chart reviewed - agree with RN documentation.   

## 2019-01-12 ENCOUNTER — Other Ambulatory Visit: Payer: Self-pay

## 2019-01-12 ENCOUNTER — Other Ambulatory Visit: Payer: Self-pay | Admitting: Obstetrics and Gynecology

## 2019-01-12 ENCOUNTER — Ambulatory Visit (INDEPENDENT_AMBULATORY_CARE_PROVIDER_SITE_OTHER): Payer: Self-pay | Admitting: *Deleted

## 2019-01-12 DIAGNOSIS — Z3400 Encounter for supervision of normal first pregnancy, unspecified trimester: Secondary | ICD-10-CM | POA: Insufficient documentation

## 2019-01-12 DIAGNOSIS — O210 Mild hyperemesis gravidarum: Secondary | ICD-10-CM

## 2019-01-12 MED ORDER — ONDANSETRON 8 MG PO TBDP: 8.0000 mg | ORAL_TABLET | Freq: Three times a day (TID) | ORAL | 0 refills | Status: DC | PRN

## 2019-01-12 NOTE — Progress Notes (Signed)
Rx for Zofran sent in per request of RN.  Laury Deep, CNM

## 2019-01-12 NOTE — Progress Notes (Signed)
  Virtual Visit via Telephone Note  I connected with Adrian Blackwater on 01/12/19 at  1:30 PM EDT by telephone and verified that I am speaking with the correct person using two identifiers.  Location: Patient: Pam Jacobson MRN: [id number] Provider: Derl Barrow, RN   I discussed the limitations, risks, security and privacy concerns of performing an evaluation and management service by telephone and the availability of in person appointments. I also discussed with the patient that there may be a patient responsible charge related to this service. The patient expressed understanding and agreed to proceed.   History of Present Illness: PRENATAL INTAKE SUMMARY  [name] presents today New OB Nurse Interview.  OB History    Gravida  1   Para      Term      Preterm      AB      Living        SAB      TAB      Ectopic      Multiple      Live Births             I have reviewed the patient's medical, obstetrical, social, and family histories, medications, and available lab results.  SUBJECTIVE She complains of nausea with vomiting. Patient was given Rx for phenergan, however patient stated she vomit 5 minutes after taking the medication.   Observations/Objective: Initial nurse interview for history/labs (New OB)  EDD: 08/07/2018 by early ultrasound GA: [redacted]w[redacted]d G1P0 FHT: non face to face interview  GENERAL APPEARANCE: non face to face interview  Assessment and Plan: Normal pregnancy Prenatal care-[facility] Pt to sign up for babyscripts. Labs/physical to be completed at next visit with midwife.  Follow Up Instructions:   I discussed the assessment and treatment plan with the patient. The patient was provided an opportunity to ask questions and all were answered. The patient agreed with the plan and demonstrated an understanding of the instructions.   The patient was advised to call back or seek an in-person evaluation if the symptoms worsen  or if the condition fails to improve as anticipated.  I provided 10 minutes of non-face-to-face time during this encounter.   Derl Barrow, RN

## 2019-01-29 ENCOUNTER — Other Ambulatory Visit: Payer: Self-pay

## 2019-01-29 ENCOUNTER — Ambulatory Visit (INDEPENDENT_AMBULATORY_CARE_PROVIDER_SITE_OTHER): Payer: Medicaid Other | Admitting: Obstetrics and Gynecology

## 2019-01-29 ENCOUNTER — Other Ambulatory Visit (HOSPITAL_COMMUNITY)
Admission: RE | Admit: 2019-01-29 | Discharge: 2019-01-29 | Disposition: A | Discharge status: Home / Self Care | Payer: Medicaid Other | Source: Ambulatory Visit | Attending: Obstetrics and Gynecology | Admitting: Obstetrics and Gynecology

## 2019-01-29 DIAGNOSIS — Z3A13 13 weeks gestation of pregnancy: Secondary | ICD-10-CM

## 2019-01-29 DIAGNOSIS — Z3400 Encounter for supervision of normal first pregnancy, unspecified trimester: Secondary | ICD-10-CM | POA: Insufficient documentation

## 2019-01-29 DIAGNOSIS — Z3401 Encounter for supervision of normal first pregnancy, first trimester: Secondary | ICD-10-CM

## 2019-01-30 LAB — OBSTETRIC PANEL, INCLUDING HIV
Antibody Screen: NEGATIVE
Basophils Absolute: 0 10*3/uL (ref 0.0–0.2)
Basos: 0 %
EOS (ABSOLUTE): 0.1 10*3/uL (ref 0.0–0.4)
Eos: 1 %
HIV Screen 4th Generation wRfx: NONREACTIVE
Hematocrit: 37.2 % (ref 34.0–46.6)
Hemoglobin: 12.8 g/dL (ref 11.1–15.9)
Hepatitis B Surface Ag: NEGATIVE
Immature Grans (Abs): 0 10*3/uL (ref 0.0–0.1)
Immature Granulocytes: 0 %
Lymphocytes Absolute: 1.8 10*3/uL (ref 0.7–3.1)
Lymphs: 25 %
MCH: 29.4 pg (ref 26.6–33.0)
MCHC: 34.4 g/dL (ref 31.5–35.7)
MCV: 86 fL (ref 79–97)
Monocytes Absolute: 0.7 10*3/uL (ref 0.1–0.9)
Monocytes: 10 %
Neutrophils Absolute: 4.6 10*3/uL (ref 1.4–7.0)
Neutrophils: 64 %
Platelets: 342 10*3/uL (ref 150–450)
RBC: 4.35 x10E6/uL (ref 3.77–5.28)
RDW: 13 % (ref 11.7–15.4)
RPR Ser Ql: NONREACTIVE
Rh Factor: POSITIVE
Rubella Antibodies, IGG: 11.5 index (ref >0.99)
WBC: 7.2 10*3/uL (ref 3.4–10.8)

## 2019-02-02 LAB — CERVICOVAGINAL ANCILLARY ONLY
Bacterial Vaginitis (gardnerella): NEGATIVE
Candida Glabrata: NEGATIVE
Candida Vaginitis: NEGATIVE
Chlamydia: NEGATIVE
Comment: NEGATIVE
Comment: NEGATIVE
Comment: NEGATIVE
Comment: NEGATIVE
Comment: NEGATIVE
Comment: NORMAL
Neisseria Gonorrhea: NEGATIVE
Trichomonas: NEGATIVE

## 2019-02-02 LAB — URINE CULTURE, OB REFLEX

## 2019-02-02 LAB — CULTURE, OB URINE

## 2019-02-03 ENCOUNTER — Encounter: Payer: Self-pay | Admitting: Obstetrics and Gynecology

## 2019-02-03 NOTE — Progress Notes (Signed)
INITIAL OBSTETRICAL VISIT Patient name: Pam Jacobson MRN [id number]  Date of birth: Feb 28, 1996 Chief Complaint:   Initial Prenatal Visit  History of Present Illness:   Pam Jacobson is a 23 y.o. G1P0 African American female at [redacted]w[redacted]d by early U/S with an Estimated Date of Delivery: 08/07/19 being seen today for her initial obstetrical visit.  Her obstetrical history is significant for none. This is an unplanned, but desired pregnancy. She and the father of the baby (FOB) "Molli Hazard" live together. She has a support system that consists of FOB, her mother/father/friends. Today she reports nausea.   Patient's last menstrual period was 10/31/2018. Last pap never had. Results were: n/a Review of Systems:   Pertinent items are noted in HPI Denies cramping/contractions, leakage of fluid, vaginal bleeding, abnormal vaginal discharge w/ itching/odor/irritation, headaches, visual changes, shortness of breath, chest pain, abdominal pain, severe nausea/vomiting, or problems with urination or bowel movements unless otherwise stated above.  Pertinent History Reviewed:  Reviewed past medical,surgical, social, obstetrical and family history.  Reviewed problem list, medications and allergies. OB History  Gravida Para Term Preterm AB Living  1            SAB TAB Ectopic Multiple Live Births               # Outcome Date GA Lbr Len/2nd Weight Sex Delivery Anes PTL Lv  1 Current            Physical Assessment:   Vitals:   01/29/19 1515  BP: 110/63  Pulse: 96  Temp: 98.3 F (36.8 C)  Weight: 125 lb (56.7 kg)  Body mass index is 22.14 kg/m.       Physical Examination:  General appearance - well appearing, and in no distress  Mental status - alert, oriented to person, place, and time  Psych:  She has a normal mood and affect  Skin - warm and dry, normal color, no suspicious lesions noted  Chest - effort normal, all lung fields clear to auscultation bilaterally  Heart - normal rate and  regular rhythm  Abdomen - soft, nontender  Extremities:  No swelling or varicosities noted  Pelvic - VULVA: normal appearing vulva with no masses, tenderness or lesions  VAGINA: normal appearing vagina with normal color and discharge, no lesions.   CERVIX: normal appearing cervix without discharge or lesions, no CMT  Thin prep pap is not done -- awaiting Medicaid coverage  will do at 36 wks visit   No results found for this or any previous visit (from the past 24 hour(s)).  Assessment & Plan:  1) Low-Risk Pregnancy G1P0 at [redacted]w[redacted]d with an Estimated Date of Delivery: 08/07/19   2) Initial OB visit  3) Supervision of normal first pregnancy, antepartum  - Cervicovaginal ancillary only( [place]),  - Obstetric Panel, Including HIV,  - ob urine culture, - [place] MFM OB COMP + 14 WK,    Meds: No orders of the defined types were placed in this encounter.   Initial labs obtained Continue prenatal vitamins Reviewed n/v relief measures and warning s/s to report Reviewed recommended weight gain based on pre-gravid BMI Encouraged well-balanced diet Genetic Screening discussed: undecided. Will await Medicaid coverage. Cystic fibrosis, SMA, Fragile X screening discussed undecided. Will await Medicaid coverage. The nature of [facility] with multiple MDs and other Advanced Practice Providers was explained to patient; also emphasized that residents, students are part of our team.  Discussed optimized OB schedule and  video visits. Advised can have an in-office visit whenever she feels she needs to be seen.  Does not have own BP cuff. Explained to patient that BP will be mailed to her house. Check BP weekly, let us know if >140/90. Advised to call during normal business hours and there is an after-hours nurse line available.    Follow-up: Return in about 2 months (around 03/31/2019) for Return OB - My Chart video.   Orders Placed This Encounter  Procedures  . ob  urine culture  . Urine Culture, OB Reflex  . [place] MFM OB COMP + 14 WK  . Obstetric Panel, Including HIV    Laury Deep MSN, CNM 01/29/2019

## 2019-02-15 ENCOUNTER — Other Ambulatory Visit: Payer: Self-pay

## 2019-02-15 ENCOUNTER — Ambulatory Visit
Admission: EM | Admit: 2019-02-15 | Discharge: 2019-02-15 | Disposition: A | Discharge status: Home / Self Care | Payer: Medicaid Other | Attending: Physician Assistant | Admitting: Physician Assistant

## 2019-02-15 DIAGNOSIS — K0889 Other specified disorders of teeth and supporting structures: Secondary | ICD-10-CM | POA: Diagnosis not present

## 2019-02-15 MED ORDER — PENICILLIN V POTASSIUM 250 MG/5ML PO SOLR: 500.0000 mg | Freq: Four times a day (QID) | ORAL | 0 refills | Status: AC

## 2019-02-15 NOTE — Discharge Instructions (Signed)
Start Penicillin as directed for dental infection. Tylenol for pain. Ice compress. Follow up with dentist for further treatment and evaluation. If experiencing swelling of the throat, trouble breathing, trouble swallowing, leaning forward to breath, drooling, go to the emergency department for further evaluation.

## 2019-02-15 NOTE — ED Provider Notes (Signed)
[facility]    CSN: [id number] Arrival date & time: 02/15/19  1009      History   Chief Complaint Chief Complaint  Patient presents with  . Dental Pain    HPI Pam Jacobson is a 23 y.o. female.   23 year old female who is [redacted] weeks pregnant comes in for few day history of right lower dental pain.  Patient states has known wisdom tooth that is growing in.  Has not had any pain until few days ago.  Denies further injury/trauma.  Has had gum swelling, facial swelling, painful chewing.  Denies swelling of the throat, tripoding, drooling, trismus.  Denies fever, chills, body aches.  Has been doing Orajel with temporary relief.     Past Medical History:  Diagnosis Date  . Medical history non-contributory     Patient Active Problem List   Diagnosis Date Noted  . Supervision of normal first pregnancy, antepartum 01/12/2019    Past Surgical History:  Procedure Laterality Date  . NO PAST SURGERIES      OB History    Gravida  1   Para      Term      Preterm      AB      Living        SAB      TAB      Ectopic      Multiple      Live Births               Home Medications    Prior to Admission medications   Medication Sig Start Date End Date Taking? Authorizing Provider  Prenatal Vit-Fe Fumarate-FA (PREPLUS) 27-1 MG TABS Take 1 tablet by mouth daily. 12/29/18  Yes Levie Heritage, DO  promethazine (PHENERGAN) 6.25 MG/5ML syrup Take 10 mLs (12.5 mg total) by mouth every 6 (six) hours as needed for nausea or vomiting. 12/17/18  Yes Judeth Horn, NP  penicillin v potassium (VEETID) 250 MG/5ML solution Take 10 mLs (500 mg total) by mouth 4 (four) times daily for 7 days. 02/15/19 02/22/19  Belinda Fisher, PA-C    Family History Family History  Problem Relation Age of Onset  . Healthy Mother   . Healthy Father     Social History Social History   Tobacco Use  . Smoking status: Former Smoker    Types: Cigarettes  . Smokeless tobacco:  Never Used  . Tobacco comment: stopped last year 2019  Substance Use Topics  . Alcohol use: Never    Frequency: Never  . Drug use: Never     Allergies   Ibuprofen   Review of Systems Review of Systems  Reason unable to perform ROS: See HPI as above.     Physical Exam Triage Vital Signs ED Triage Vitals  Enc Vitals Group     BP 02/15/19 1019 101/65     Pulse Rate 02/15/19 1019 67     Resp 02/15/19 1019 18     Temp 02/15/19 1019 98.5 F (36.9 C)     Temp src --      SpO2 02/15/19 1019 98 %     Weight --      Height --      Head Circumference --      Peak Flow --      Pain Score 02/15/19 1016 8     Pain Loc --      Pain Edu? --      Excl. in GC? --  No data found.  Updated Vital Signs BP 101/65   Pulse 67   Temp 98.5 F (36.9 C)   Resp 18   LMP 10/31/2018   SpO2 98%   Visual Acuity Right Eye Distance:   Left Eye Distance:   Bilateral Distance:    Right Eye Near:   Left Eye Near:    Bilateral Near:     Physical Exam Constitutional:      General: She is not in acute distress.    Appearance: She is well-developed. She is not ill-appearing, toxic-appearing or diaphoretic.  HENT:     Head: Normocephalic and atraumatic.     Jaw: No trismus.     Mouth/Throat:     Mouth: Mucous membranes are moist.     Pharynx: Oropharynx is clear. Uvula midline. No uvula swelling.     Tonsils: No tonsillar exudate.     Comments: Tenderness to palpation right bottom wisdom tooth with swelling to surrounding gums. No fluctuance felt.  Floor of mouth soft to palpation. No facial swelling.  Neck:     Musculoskeletal: Normal range of motion and neck supple.  Skin:    General: Skin is warm and dry.  Neurological:     Mental Status: She is alert and oriented to person, place, and time.      UC Treatments / Results  Labs (all labs ordered are listed, but only abnormal results are displayed) Labs Reviewed - No data to display  EKG   Radiology No results  found.  Procedures Procedures (including critical care time)  Medications Ordered in UC Medications - No data to display  Initial Impression / Assessment and Plan / UC Course  I have reviewed the triage vital signs and the nursing notes.  Pertinent labs & imaging results that were available during my care of the patient were reviewed by me and considered in my medical decision making (see chart for details).    Start antibiotics for possible dental infection. Symptomatic treatment as needed. Discussed with patient symptoms can return if dental problem is not addressed. Follow up with dentist for further evaluation and treatment of dental pain. Resources given. Return precautions given.   Final Clinical Impressions(s) / UC Diagnoses   Final diagnoses:  Pain, dental   ED Prescriptions    Medication Sig Dispense Auth. Provider   penicillin v potassium (VEETID) 250 MG/5ML solution Take 10 mLs (500 mg total) by mouth 4 (four) times daily for 7 days. 280 mL Ok Edwards, PA-C     PDMP not reviewed this encounter.   Ok Edwards, PA-C 02/15/19 1042

## 2019-02-15 NOTE — ED Triage Notes (Addendum)
Pt presents with complaints of right sided dental pain on the bottom of her mouth. Reports it started one day ago after eating. Patient states it is painful to talk and eat. Denies any other symptoms. Reports temporary relief with orajel. Pt is [redacted] weeks pregnant.

## 2019-03-16 ENCOUNTER — Other Ambulatory Visit (HOSPITAL_COMMUNITY): Payer: Self-pay | Admitting: *Deleted

## 2019-03-16 ENCOUNTER — Other Ambulatory Visit: Payer: Self-pay

## 2019-03-16 ENCOUNTER — Ambulatory Visit (HOSPITAL_COMMUNITY)
Admission: RE | Admit: 2019-03-16 | Discharge: 2019-03-16 | Disposition: A | Discharge status: Home / Self Care | Payer: Medicaid Other | Source: Ambulatory Visit | Attending: Obstetrics and Gynecology | Admitting: Obstetrics and Gynecology

## 2019-03-16 DIAGNOSIS — Z3A19 19 weeks gestation of pregnancy: Secondary | ICD-10-CM

## 2019-03-16 DIAGNOSIS — Z3400 Encounter for supervision of normal first pregnancy, unspecified trimester: Secondary | ICD-10-CM | POA: Insufficient documentation

## 2019-03-16 DIAGNOSIS — Z363 Encounter for antenatal screening for malformations: Secondary | ICD-10-CM

## 2019-03-16 DIAGNOSIS — Z362 Encounter for other antenatal screening follow-up: Secondary | ICD-10-CM

## 2019-03-26 ENCOUNTER — Telehealth (INDEPENDENT_AMBULATORY_CARE_PROVIDER_SITE_OTHER): Payer: Medicaid Other | Admitting: Obstetrics and Gynecology

## 2019-03-26 DIAGNOSIS — Z3400 Encounter for supervision of normal first pregnancy, unspecified trimester: Secondary | ICD-10-CM

## 2019-03-26 DIAGNOSIS — Z3402 Encounter for supervision of normal first pregnancy, second trimester: Secondary | ICD-10-CM

## 2019-03-26 DIAGNOSIS — Z3A21 21 weeks gestation of pregnancy: Secondary | ICD-10-CM

## 2019-03-26 MED ORDER — BLOOD PRESSURE MONITOR AUTOMAT DEVI: 1.0000 | Freq: Every day | 0 refills | Status: DC

## 2019-03-30 ENCOUNTER — Encounter: Payer: Self-pay | Admitting: Obstetrics and Gynecology

## 2019-03-30 NOTE — Progress Notes (Signed)
   MY CHART VIDEO VIRTUAL OBSTETRICS VISIT ENCOUNTER NOTE  I connected with Pam Jacobson on 03/26/19 at 10:50 AM EST by My Chart video at home and verified that I am speaking with the correct person using two identifiers.   I discussed the limitations, risks, security and privacy concerns of performing an evaluation and management service by My Chart video and the availability of in person appointments. I also discussed with the patient that there may be a patient responsible charge related to this service. The patient expressed understanding and agreed to proceed.  Subjective:  Pam Jacobson is a 24 y.o. G1P0 at [redacted]w[redacted]d being followed for ongoing prenatal care.  She is currently monitored for the following issues for this low-risk pregnancy and has Supervision of normal first pregnancy, antepartum on their problem list.  Patient reports no complaints. Reports fetal movement. Denies any contractions, bleeding or leaking of fluid.   The following portions of the patient's history were reviewed and updated as appropriate: allergies, current medications, past family history, past medical history, past social history, past surgical history and problem list.   Objective:   General:  Alert, oriented and cooperative.   Mental Status: Normal mood and affect perceived. Normal judgment and thought content.  Rest of physical exam deferred due to type of encounter  LMP 10/31/2018  **Patient did not receive her BP cuff and scale  Assessment and Plan:  Pregnancy: G1P0 at [redacted]w[redacted]d  1. Supervision of normal first pregnancy, antepartum - Blood Pressure Monitoring (BLOOD PRESSURE MONITOR AUTOMAT) DEVI; 1 Device by Does not apply route daily. Automatic blood pressure cuff regular size. To monitor blood pressure regularly at home. ICD-10 code: O0.90.  Dispense: 1 each; Refill: 0 - No complaints of wisdom tooth pain - she still needs to see a dentist. Referral list requested.  Preterm labor symptoms and  general obstetric precautions including but not limited to vaginal bleeding, contractions, leaking of fluid and fetal movement were reviewed in detail with the patient.  I discussed the assessment and treatment plan with the patient. The patient was provided an opportunity to ask questions and all were answered. The patient agreed with the plan and demonstrated an understanding of the instructions. The patient was advised to call back or seek an in-person office evaluation/go to MAU at [facility] for any urgent or concerning symptoms. Please refer to After Visit Summary for other counseling recommendations.   I provided 5 minutes of non-face-to-face time during this encounter. There was 5 minutes of chart review time spent prior to this encounter. Total time spent = 10 minutes.  Return in about 4 weeks (around 04/23/2019) for Return OB - My Chart video.  Future Appointments  Date Time Provider Department Center  04/13/2019 12:45 PM [facility] [place] [facility]  04/23/2019  9:50 AM Raelyn Mora, CNM [facility] None    Raelyn Mora, CNM Center for [employer], [facility]

## 2019-04-13 ENCOUNTER — Ambulatory Visit (HOSPITAL_COMMUNITY): Payer: Medicaid Other

## 2019-04-22 ENCOUNTER — Other Ambulatory Visit: Payer: Self-pay

## 2019-04-22 ENCOUNTER — Ambulatory Visit (HOSPITAL_COMMUNITY)
Admission: RE | Admit: 2019-04-22 | Discharge: 2019-04-22 | Disposition: A | Discharge status: Home / Self Care | Payer: Medicaid Other | Source: Ambulatory Visit | Attending: Obstetrics and Gynecology | Admitting: Obstetrics and Gynecology

## 2019-04-22 DIAGNOSIS — Z3A24 24 weeks gestation of pregnancy: Secondary | ICD-10-CM | POA: Diagnosis not present

## 2019-04-22 DIAGNOSIS — Z362 Encounter for other antenatal screening follow-up: Secondary | ICD-10-CM | POA: Insufficient documentation

## 2019-04-23 ENCOUNTER — Telehealth (INDEPENDENT_AMBULATORY_CARE_PROVIDER_SITE_OTHER): Payer: Medicaid Other | Admitting: Obstetrics and Gynecology

## 2019-04-23 ENCOUNTER — Encounter: Payer: Self-pay | Admitting: Obstetrics and Gynecology

## 2019-04-23 DIAGNOSIS — Z3400 Encounter for supervision of normal first pregnancy, unspecified trimester: Secondary | ICD-10-CM

## 2019-04-23 NOTE — Patient Instructions (Signed)
Glucose Tolerance Test During Pregnancy Why am I having this test? The glucose tolerance test (GTT) is done to check how your body processes sugar (glucose). This is one of several tests used to diagnose diabetes that develops during pregnancy (gestational diabetes mellitus). Gestational diabetes is a temporary form of diabetes that some women develop during pregnancy. It usually occurs during the second trimester of pregnancy and goes away after delivery. Testing (screening) for gestational diabetes usually occurs between 24 and 28 weeks of pregnancy. You may have the GTT test after having a 1-hour glucose screening test if the results from that test indicate that you may have gestational diabetes. You may also have this test if:  You have a history of gestational diabetes.  You have a history of giving birth to very large babies or have experienced repeated fetal loss (stillbirth).  You have signs and symptoms of diabetes, such as: ? Changes in your vision. ? Tingling or numbness in your hands or feet. ? Changes in hunger, thirst, and urination that are not otherwise explained by your pregnancy. What is being tested? This test measures the amount of glucose in your blood at different times during a period of 3 hours. This indicates how well your body is able to process glucose. What kind of sample is taken?  Blood samples are required for this test. They are usually collected by inserting a needle into a blood vessel. How do I prepare for this test?  For 3 days before your test, eat normally. Have plenty of carbohydrate-rich foods.  Follow instructions from your health care provider about: ? Eating or drinking restrictions on the day of the test. You may be asked to not eat or drink anything other than water (fast) starting 8-10 hours before the test. ? Changing or stopping your regular medicines. Some medicines may interfere with this test. Tell a health care provider about:  All  medicines you are taking, including vitamins, herbs, eye drops, creams, and over-the-counter medicines.  Any blood disorders you have.  Any surgeries you have had.  Any medical conditions you have. What happens during the test? First, your blood glucose will be measured. This is referred to as your fasting blood glucose, since you fasted before the test. Then, you will drink a glucose solution that contains a certain amount of glucose. Your blood glucose will be measured again 1, 2, and 3 hours after drinking the solution. This test takes about 3 hours to complete. You will need to stay at the testing location during this time. During the testing period:  Do not eat or drink anything other than the glucose solution.  Do not exercise.  Do not use any products that contain nicotine or tobacco, such as cigarettes and e-cigarettes. If you need help stopping, ask your health care provider. The testing procedure may vary among health care providers and hospitals. How are the results reported? Your results will be reported as milligrams of glucose per deciliter of blood (mg/dL) or millimoles per liter (mmol/L). Your health care provider will compare your results to normal ranges that were established after testing a large group of people (reference ranges). Reference ranges may vary among labs and hospitals. For this test, common reference ranges are:  Fasting: less than 95-105 mg/dL (5.3-5.8 mmol/L).  1 hour after drinking glucose: less than 180-190 mg/dL (10.0-10.5 mmol/L).  2 hours after drinking glucose: less than 155-165 mg/dL (8.6-9.2 mmol/L).  3 hours after drinking glucose: 140-145 mg/dL (7.8-8.1 mmol/L). What do the   results mean? Results within reference ranges are considered normal, meaning that your glucose levels are well-controlled. If two or more of your blood glucose levels are high, you may be diagnosed with gestational diabetes. If only one level is high, your health care  provider may suggest repeat testing or other tests to confirm a diagnosis. Talk with your health care provider about what your results mean. Questions to ask your health care provider Ask your health care provider, or the department that is doing the test:  When will my results be ready?  How will I get my results?  What are my treatment options?  What other tests do I need?  What are my next steps? Summary  The glucose tolerance test (GTT) is one of several tests used to diagnose diabetes that develops during pregnancy (gestational diabetes mellitus). Gestational diabetes is a temporary form of diabetes that some women develop during pregnancy.  You may have the GTT test after having a 1-hour glucose screening test if the results from that test indicate that you may have gestational diabetes. You may also have this test if you have any symptoms or risk factors for gestational diabetes.  Talk with your health care provider about what your results mean. This information is not intended to replace advice given to you by your health care provider. Make sure you discuss any questions you have with your health care provider. Document Revised: 06/26/2018 Document Reviewed: 10/15/2016 Elsevier Patient Education  2020 Elsevier Inc.  

## 2019-04-23 NOTE — Progress Notes (Signed)
Patient contacted by Dorisann Frames, RN VS not done by patient at the time of her call with RN. Patient was supposed to notify CNM of VS.  Patient not present via My Chart video for visit with provider. Text message sent through My Chart inviting patient to join visit through My Chart. Patient did not join video invite before call ended.   Attempt #3 to contact patient through [employer] phone call -- no answer.  Attempt #4 to contact patient through [employer] video call -- no answer.   Patient to be scheduled for 2 hr GTT and 3rd trimester labs in 4 wks.  Raelyn Mora, CNM  04/23/2019 1:20 PM

## 2019-05-12 ENCOUNTER — Encounter (HOSPITAL_COMMUNITY): Payer: Self-pay | Admitting: Obstetrics and Gynecology

## 2019-05-12 ENCOUNTER — Inpatient Hospital Stay (HOSPITAL_COMMUNITY): Payer: Medicaid Other

## 2019-05-12 ENCOUNTER — Inpatient Hospital Stay (HOSPITAL_COMMUNITY)
Admission: AD | Admit: 2019-05-12 | Discharge: 2019-05-12 | Disposition: A | Discharge status: Home / Self Care | Payer: Medicaid Other | Attending: Obstetrics and Gynecology | Admitting: Obstetrics and Gynecology

## 2019-05-12 ENCOUNTER — Other Ambulatory Visit: Payer: Self-pay

## 2019-05-12 DIAGNOSIS — R1011 Right upper quadrant pain: Secondary | ICD-10-CM | POA: Insufficient documentation

## 2019-05-12 DIAGNOSIS — O26899 Other specified pregnancy related conditions, unspecified trimester: Secondary | ICD-10-CM

## 2019-05-12 DIAGNOSIS — R7989 Other specified abnormal findings of blood chemistry: Secondary | ICD-10-CM

## 2019-05-12 DIAGNOSIS — O212 Late vomiting of pregnancy: Secondary | ICD-10-CM | POA: Insufficient documentation

## 2019-05-12 DIAGNOSIS — O26892 Other specified pregnancy related conditions, second trimester: Secondary | ICD-10-CM | POA: Diagnosis present

## 2019-05-12 DIAGNOSIS — Z3689 Encounter for other specified antenatal screening: Secondary | ICD-10-CM

## 2019-05-12 DIAGNOSIS — Z3A27 27 weeks gestation of pregnancy: Secondary | ICD-10-CM

## 2019-05-12 LAB — URINALYSIS, ROUTINE W REFLEX MICROSCOPIC
Bilirubin Urine: NEGATIVE
Glucose, UA: NEGATIVE mg/dL
Hgb urine dipstick: NEGATIVE
Ketones, ur: NEGATIVE mg/dL
Leukocytes,Ua: NEGATIVE
Nitrite: NEGATIVE
Protein, ur: NEGATIVE mg/dL
Specific Gravity, Urine: 1.009 (ref 1.005–1.030)
pH: 7 (ref 5.0–8.0)

## 2019-05-12 LAB — COMPREHENSIVE METABOLIC PANEL
ALT: 49 U/L — ABNORMAL HIGH (ref 0–44)
AST: 88 U/L — ABNORMAL HIGH (ref 15–41)
Albumin: 3.4 g/dL — ABNORMAL LOW (ref 3.5–5.0)
Alkaline Phosphatase: 78 U/L (ref 38–126)
Anion gap: 12 (ref 5–15)
BUN: <5 mg/dL — ABNORMAL LOW (ref 6–20)
CO2: 20 mmol/L — ABNORMAL LOW (ref 22–32)
Calcium: 9.2 mg/dL (ref 8.9–10.3)
Chloride: 105 mmol/L (ref 98–111)
Creatinine, Ser: 0.52 mg/dL (ref 0.44–1.00)
GFR calc Af Amer: >60 mL/min (ref >60)
GFR calc non Af Amer: >60 mL/min (ref >60)
Glucose, Bld: 85 mg/dL (ref 70–99)
Potassium: 3.7 mmol/L (ref 3.5–5.1)
Sodium: 137 mmol/L (ref 135–145)
Total Bilirubin: 0.3 mg/dL (ref 0.3–1.2)
Total Protein: 7.1 g/dL (ref 6.5–8.1)

## 2019-05-12 LAB — CBC
HCT: 32.3 % — ABNORMAL LOW (ref 36.0–46.0)
Hemoglobin: 10.6 g/dL — ABNORMAL LOW (ref 12.0–15.0)
MCH: 29.9 pg (ref 26.0–34.0)
MCHC: 32.8 g/dL (ref 30.0–36.0)
MCV: 91.2 fL (ref 80.0–100.0)
Platelets: 254 10*3/uL (ref 150–400)
RBC: 3.54 MIL/uL — ABNORMAL LOW (ref 3.87–5.11)
RDW: 11.8 % (ref 11.5–15.5)
WBC: 11.2 10*3/uL — ABNORMAL HIGH (ref 4.0–10.5)
nRBC: 0 % (ref 0.0–0.2)

## 2019-05-12 LAB — PROTEIN / CREATININE RATIO, URINE
Creatinine, Urine: 50.22 mg/dL
Protein Creatinine Ratio: 0.16 mg/mg{Cre} — ABNORMAL HIGH (ref 0.00–0.15)
Total Protein, Urine: 8 mg/dL

## 2019-05-12 MED ORDER — LACTATED RINGERS IV BOLUS
1000.0000 mL | Freq: Once | INTRAVENOUS | Status: AC
  Administered 2019-05-12: 08:00:00 1000 mL via INTRAVENOUS

## 2019-05-12 MED ORDER — FAMOTIDINE 20 MG PO TABS: 20.0000 mg | ORAL_TABLET | Freq: Every day | ORAL | 2 refills | Status: DC

## 2019-05-12 MED ORDER — FAMOTIDINE IN NACL 20-0.9 MG/50ML-% IV SOLN
20.0000 mg | Freq: Once | INTRAVENOUS | Status: AC
  Administered 2019-05-12: 08:00:00 20 mg via INTRAVENOUS
  Filled 2019-05-12: qty 50

## 2019-05-12 MED ORDER — ONDANSETRON HCL 4 MG/2ML IJ SOLN
4.0000 mg | Freq: Once | INTRAMUSCULAR | Status: AC
  Administered 2019-05-12: 08:00:00 4 mg via INTRAVENOUS
  Filled 2019-05-12: qty 2

## 2019-05-12 NOTE — Discharge Instructions (Signed)
Gastroesophageal Reflux Disease, Adult Gastroesophageal reflux (GER) happens when acid from the stomach flows up into the tube that connects the mouth and the stomach (esophagus). Normally, food travels down the esophagus and stays in the stomach to be digested. However, when a person has GER, food and stomach acid sometimes move back up into the esophagus. If this becomes a more serious problem, the person may be diagnosed with a disease called gastroesophageal reflux disease (GERD). GERD occurs when the reflux:  Happens often.  Causes frequent or severe symptoms.  Causes problems such as damage to the esophagus. When stomach acid comes in contact with the esophagus, the acid may cause soreness (inflammation) in the esophagus. Over time, GERD may create small holes (ulcers) in the lining of the esophagus. What are the causes? This condition is caused by a problem with the muscle between the esophagus and the stomach (lower esophageal sphincter, or LES). Normally, the LES muscle closes after food passes through the esophagus to the stomach. When the LES is weakened or abnormal, it does not close properly, and that allows food and stomach acid to go back up into the esophagus. The LES can be weakened by certain dietary substances, medicines, and medical conditions, including:  Tobacco use.  Pregnancy.  Having a hiatal hernia.  Alcohol use.  Certain foods and beverages, such as coffee, chocolate, onions, and peppermint. What increases the risk? You are more likely to develop this condition if you:  Have an increased body weight.  Have a connective tissue disorder.  Use NSAID medicines. What are the signs or symptoms? Symptoms of this condition include:  Heartburn.  Difficult or painful swallowing.  The feeling of having a lump in the throat.  Abitter taste in the mouth.  Bad breath.  Having a large amount of saliva.  Having an upset or bloated  stomach.  Belching.  Chest pain. Different conditions can cause chest pain. Make sure you see your health care provider if you experience chest pain.  Shortness of breath or wheezing.  Ongoing (chronic) cough or a night-time cough.  Wearing away of tooth enamel.  Weight loss. How is this diagnosed? Your health care provider will take a medical history and perform a physical exam. To determine if you have mild or severe GERD, your health care provider may also monitor how you respond to treatment. You may also have tests, including:  A test to examine your stomach and esophagus with a small camera (endoscopy).  A test thatmeasures the acidity level in your esophagus.  A test thatmeasures how much pressure is on your esophagus.  A barium swallow or modified barium swallow test to show the shape, size, and functioning of your esophagus. How is this treated? The goal of treatment is to help relieve your symptoms and to prevent complications. Treatment for this condition may vary depending on how severe your symptoms are. Your health care provider may recommend:  Changes to your diet.  Medicine.  Surgery. Follow these instructions at home: Eating and drinking   Follow a diet as recommended by your health care provider. This may involve avoiding foods and drinks such as: ? Coffee and tea (with or without caffeine). ? Drinks that containalcohol. ? Energy drinks and sports drinks. ? Carbonated drinks or sodas. ? Chocolate and cocoa. ? Peppermint and mint flavorings. ? Garlic and onions. ? Horseradish. ? Spicy and acidic foods, including peppers, chili powder, curry powder, vinegar, hot sauces, and barbecue sauce. ? Citrus fruit juices and citrus   fruits, such as oranges, lemons, and limes. ? Tomato-based foods, such as red sauce, chili, salsa, and pizza with red sauce. ? Fried and fatty foods, such as donuts, french fries, potato chips, and high-fat dressings. ? High-fat  meats, such as hot dogs and fatty cuts of red and white meats, such as rib eye steak, sausage, ham, and bacon. ? High-fat dairy items, such as whole milk, butter, and cream cheese.  Eat small, frequent meals instead of large meals.  Avoid drinking large amounts of liquid with your meals.  Avoid eating meals during the 2-3 hours before bedtime.  Avoid lying down right after you eat.  Do not exercise right after you eat. Lifestyle   Do not use any products that contain nicotine or tobacco, such as cigarettes, e-cigarettes, and chewing tobacco. If you need help quitting, ask your health care provider.  Try to reduce your stress by using methods such as yoga or meditation. If you need help reducing stress, ask your health care provider.  If you are overweight, reduce your weight to an amount that is healthy for you. Ask your health care provider for guidance about a safe weight loss goal. General instructions  Pay attention to any changes in your symptoms.  Take over-the-counter and prescription medicines only as told by your health care provider. Do not take aspirin, ibuprofen, or other NSAIDs unless your health care provider told you to do so.  Wear loose-fitting clothing. Do not wear anything tight around your waist that causes pressure on your abdomen.  Raise (elevate) the head of your bed about 6 inches (15 cm).  Avoid bending over if this makes your symptoms worse.  Keep all follow-up visits as told by your health care provider. This is important. Contact a health care provider if:  You have: ? New symptoms. ? Unexplained weight loss. ? Difficulty swallowing or it hurts to swallow. ? Wheezing or a persistent cough. ? A hoarse voice.  Your symptoms do not improve with treatment. Get help right away if you:  Have pain in your arms, neck, jaw, teeth, or back.  Feel sweaty, dizzy, or light-headed.  Have chest pain or shortness of breath.  Vomit and your vomit looks  like blood or coffee grounds.  Faint.  Have stool that is bloody or black.  Cannot swallow, drink, or eat. Summary  Gastroesophageal reflux happens when acid from the stomach flows up into the esophagus. GERD is a disease in which the reflux happens often, causes frequent or severe symptoms, or causes problems such as damage to the esophagus.  Treatment for this condition may vary depending on how severe your symptoms are. Your health care provider may recommend diet and lifestyle changes, medicine, or surgery.  Contact a health care provider if you have new or worsening symptoms.  Take over-the-counter and prescription medicines only as told by your health care provider. Do not take aspirin, ibuprofen, or other NSAIDs unless your health care provider told you to do so.  Keep all follow-up visits as told by your health care provider. This is important. This information is not intended to replace advice given to you by your health care provider. Make sure you discuss any questions you have with your health care provider. Document Revised: 09/11/2017 Document Reviewed: 09/11/2017 Elsevier Patient Education  2020 Elsevier Inc.  

## 2019-05-12 NOTE — MAU Note (Addendum)
Pt states the past few days she intermittent upper abdm pain on right side that she describes as "gripping & burning".  States she began vomiting approx 0400 & has vomited 3 times since arrival to MAU.  Denies h/a or visual disturbances. Denies LOF, VB, or UCs. States +FM.  Pt states she had a prescription for Zofran from beginning of pregnancy, but has not had it refilled due to no complaints of n/v for some time.

## 2019-05-12 NOTE — MAU Provider Note (Addendum)
History     CSN: [id number]  Arrival date and time: 05/12/19 1660   First Provider Initiated Contact with Patient 05/12/19 [phone]      Chief Complaint  Patient presents with  . Abdominal Pain  . Emesis   Pam Jacobson is a 24 y.o. G1P0 at [redacted]w[redacted]d who presents to MAU with complaints of abdominal pain and emesis. She reports abdominal pain has been occurring for the past 2-3 days, describes the pain as burning and twisting pain in her upper right quadrant that radiates across her upper abdomen. Rates pain 4/10- has not taken any medication for abdominal pain. Patient reports emesis that started occurring around 0400 this morning. Patient reports she has vomited 3 times prior to arrival to MAU at 0800. She denies HA, vision changes, contractions, vaginal bleeding or vaginal discharge. She reports +FM .   She denies hx of GERD, heartburn, ulcer, cholestasis.    OB History    Gravida  1   Para      Term      Preterm      AB      Living        SAB      TAB      Ectopic      Multiple      Live Births              Past Medical History:  Diagnosis Date  . Medical history non-contributory     Past Surgical History:  Procedure Laterality Date  . NO PAST SURGERIES      Family History  Problem Relation Age of Onset  . Healthy Mother   . Healthy Father     Social History   Tobacco Use  . Smoking status: Never Smoker  . Smokeless tobacco: Never Used  Substance Use Topics  . Alcohol use: Never  . Drug use: Never    Allergies:  Allergies  Allergen Reactions  . Ibuprofen Itching and Nausea And Vomiting    Medications Prior to Admission  Medication Sig Dispense Refill Last Dose  . Blood Pressure Monitoring (BLOOD PRESSURE MONITOR AUTOMAT) DEVI 1 Device by Does not apply route daily. Automatic blood pressure cuff regular size. To monitor blood pressure regularly at home. ICD-10 code: O0.90. 1 each 0   . Prenatal Vit-Fe Fumarate-FA (PREPLUS) 27-1 MG  TABS Take 1 tablet by mouth daily. 30 tablet 11   . promethazine (PHENERGAN) 6.25 MG/5ML syrup Take 10 mLs (12.5 mg total) by mouth every 6 (six) hours as needed for nausea or vomiting. 120 mL 1     Review of Systems  Constitutional: Negative.   Respiratory: Negative.   Cardiovascular: Negative.   Gastrointestinal: Positive for abdominal pain and vomiting. Negative for constipation and diarrhea.  Genitourinary: Negative.   Musculoskeletal: Negative.   Neurological: Negative.    Physical Exam   Blood pressure 116/61, pulse 84, temperature 98 F (36.7 C), temperature source Oral, resp. rate 18, height 5\' 2"  (1.575 m), weight 69.7 kg, last menstrual period 10/31/2018, SpO2 98 %.  Physical Exam  Nursing note and vitals reviewed. Constitutional: She is oriented to person, place, and time. She appears well-developed and well-nourished. No distress.  Cardiovascular: Normal rate and regular rhythm.  Respiratory: Effort normal and breath sounds normal. No respiratory distress. She has no wheezes.  GI: Soft. She exhibits no distension. There is abdominal tenderness. There is no rebound and no guarding.  Tenderness in RUQ and epigastric region   Musculoskeletal:  General: No edema. Normal range of motion.  Neurological: She is alert and oriented to person, place, and time.  Psychiatric: She has a normal mood and affect. Her behavior is normal. Thought content normal.   MAU Course  Procedures Results for orders placed or performed during the hospital encounter of 05/12/19 (from the past 24 hour(s))  Urinalysis, Routine w reflex microscopic     Status: Abnormal   Collection Time: 05/12/19  7:46 AM  Result Value Ref Range   Color, Urine YELLOW YELLOW   APPearance HAZY (A) CLEAR   Specific Gravity, Urine 1.009 1.005 - 1.030   pH 7.0 5.0 - 8.0   Glucose, UA NEGATIVE NEGATIVE mg/dL   Hgb urine dipstick NEGATIVE NEGATIVE   Bilirubin Urine NEGATIVE NEGATIVE   Ketones, ur NEGATIVE  NEGATIVE mg/dL   Protein, ur NEGATIVE NEGATIVE mg/dL   Nitrite NEGATIVE NEGATIVE   Leukocytes,Ua NEGATIVE NEGATIVE  CBC     Status: Abnormal   Collection Time: 05/12/19  8:14 AM  Result Value Ref Range   WBC 11.2 (H) 4.0 - 10.5 K/uL   RBC 3.54 (L) 3.87 - 5.11 MIL/uL   Hemoglobin 10.6 (L) 12.0 - 15.0 g/dL   HCT 16.1 (L) 09.6 - 04.5 %   MCV 91.2 80.0 - 100.0 fL   MCH 29.9 26.0 - 34.0 pg   MCHC 32.8 30.0 - 36.0 g/dL   RDW 40.9 81.1 - 91.4 %   Platelets 254 150 - 400 K/uL   nRBC 0.0 0.0 - 0.2 %  Comprehensive metabolic panel     Status: Abnormal   Collection Time: 05/12/19  8:14 AM  Result Value Ref Range   Sodium 137 135 - 145 mmol/L   Potassium 3.7 3.5 - 5.1 mmol/L   Chloride 105 98 - 111 mmol/L   CO2 20 (L) 22 - 32 mmol/L   Glucose, Bld 85 70 - 99 mg/dL   BUN <5 (L) 6 - 20 mg/dL   Creatinine, Ser 7.82 0.44 - 1.00 mg/dL   Calcium 9.2 8.9 - 95.6 mg/dL   Total Protein 7.1 6.5 - 8.1 g/dL   Albumin 3.4 (L) 3.5 - 5.0 g/dL   AST 88 (H) 15 - 41 U/L   ALT 49 (H) 0 - 44 U/L   Alkaline Phosphatase 78 38 - 126 U/L   Total Bilirubin 0.3 0.3 - 1.2 mg/dL   GFR calc non Af Amer >60 >60 mL/min   GFR calc Af Amer >60 >60 mL/min   Anion gap 12 5 - 15  Protein / creatinine ratio, urine     Status: Abnormal   Collection Time: 05/12/19  8:14 AM  Result Value Ref Range   Creatinine, Urine 50.22 mg/dL   Total Protein, Urine 8 mg/dL   Protein Creatinine Ratio 0.16 (H) 0.00 - 0.15 mg/mg[Cre]   US Abdomen Complete  Result Date: 05/12/2019 CLINICAL DATA:  Right upper quadrant pain, elevated LFTs EXAM: ABDOMEN ULTRASOUND COMPLETE COMPARISON:  None. FINDINGS: Gallbladder: No gallstones, gallbladder wall thickening, or pericholecystic fluid. Negative sonographic Murphy's sign. Common bile duct: Diameter: 2 mm Liver: No focal lesion identified. Within normal limits in parenchymal echogenicity. Portal vein is patent on color Doppler imaging with normal direction of blood flow towards the liver. IVC: No  abnormality visualized. Pancreas: Visualized portion unremarkable. Spleen: Size and appearance within normal limits. Right Kidney: Length: 11.4. Echogenicity within normal limits. No mass or hydronephrosis visualized. Left Kidney: Length: 11.2. Echogenicity within normal limits. No mass or hydronephrosis visualized. Abdominal aorta: No  aneurysm visualized. Other findings: None. IMPRESSION: Negative right upper quadrant ultrasound. Electronically Signed   By: Julian Hy M.D.   On: 05/12/2019 09:58   ' MDM Orders Placed This Encounter  Procedures  . Urinalysis, Routine w reflex microscopic  . CBC  . Comprehensive metabolic panel  . Protein / creatinine ratio, urine  . Insert peripheral IV   Labs pending  Care taken over by Milinda Cave @ [facility], CNM 05/12/19, 8:38 AM   Assessment and Plan  Reassessment (9:13 AM) -Labs return as above -LFTs notably elevated. -Will send for abdominal US   Reassessment (10:20 AM)  -Patient returns from [place] and results as above. -Dr. Gardiner Fanti consulted and advised patient with repeat LFTs at next appt and initiation of pepcid. -Patient informed of POC and without questions or concerns. -Medication sent to pharmacy on file.  -Encouraged to call or return to MAU if symptoms worsen or with the onset of new symptoms. -Discharged to home in improved condition.  Maryann Conners MSN, CNM Advanced Practice Provider, [facility] for [facility]

## 2019-05-15 ENCOUNTER — Encounter: Payer: Self-pay | Admitting: General Practice

## 2019-05-15 ENCOUNTER — Other Ambulatory Visit: Payer: Self-pay

## 2019-05-15 ENCOUNTER — Ambulatory Visit (INDEPENDENT_AMBULATORY_CARE_PROVIDER_SITE_OTHER): Payer: Medicaid Other | Admitting: Advanced Practice Midwife

## 2019-05-15 ENCOUNTER — Encounter: Payer: Self-pay | Admitting: Advanced Practice Midwife

## 2019-05-15 DIAGNOSIS — Z3A28 28 weeks gestation of pregnancy: Secondary | ICD-10-CM

## 2019-05-15 DIAGNOSIS — Z3491 Encounter for supervision of normal pregnancy, unspecified, first trimester: Secondary | ICD-10-CM

## 2019-05-15 DIAGNOSIS — Z3403 Encounter for supervision of normal first pregnancy, third trimester: Secondary | ICD-10-CM

## 2019-05-15 DIAGNOSIS — O219 Vomiting of pregnancy, unspecified: Secondary | ICD-10-CM

## 2019-05-15 DIAGNOSIS — Z3400 Encounter for supervision of normal first pregnancy, unspecified trimester: Secondary | ICD-10-CM

## 2019-05-15 MED ORDER — BLOOD PRESSURE MONITOR AUTOMAT DEVI: 1.0000 | Freq: Every day | 0 refills | Status: DC

## 2019-05-15 MED ORDER — PROMETHAZINE HCL 6.25 MG/5ML PO SYRP: 12.5000 mg | ORAL_SOLUTION | Freq: Four times a day (QID) | ORAL | 1 refills | Status: DC | PRN

## 2019-05-15 MED ORDER — PREPLUS 27-1 MG PO TABS: 1.0000 | ORAL_TABLET | Freq: Every day | ORAL | 11 refills | Status: AC

## 2019-05-15 NOTE — Progress Notes (Signed)
   PRENATAL VISIT NOTE  Subjective:  Pam Jacobson is a 23 y.o. G1P0 at [redacted]w[redacted]d being seen today for ongoing prenatal care.  She is currently monitored for the following issues for this low-risk pregnancy and has Supervision of normal first pregnancy, antepartum on their problem list.  Patient reports no complaints.  Contractions: Not present. Vag. Bleeding: None.  Movement: Present. Denies leaking of fluid.   The following portions of the patient's history were reviewed and updated as appropriate: allergies, current medications, past family history, past medical history, past social history, past surgical history and problem list.   Objective:   Vitals:   05/15/19 0846  BP: (!) 108/57  Pulse: 84  Temp: 98 F (36.7 C)  Weight: 149 lb 12.8 oz (67.9 kg)    Fetal Status: Fetal Heart Rate (bpm): 131 Fundal Height: 29 cm Movement: Present     General:  Alert, oriented and cooperative. Patient is in no acute distress.  Skin: Skin is warm and dry. No rash noted.   Cardiovascular: Normal heart rate noted  Respiratory: Normal respiratory effort, no problems with respiration noted  Abdomen: Soft, gravid, appropriate for gestational age.  Pain/Pressure: Absent     Pelvic: Cervical exam deferred        Extremities: Normal range of motion.  Edema: Trace  Mental Status: Normal mood and affect. Normal behavior. Normal judgment and thought content.   Assessment and Plan:  Pregnancy: G1P0 at [redacted]w[redacted]d 1. Supervision of normal first pregnancy, antepartum - Blood Pressure Monitoring (BLOOD PRESSURE MONITOR AUTOMAT) DEVI; 1 Device by Does not apply route daily. Automatic blood pressure cuff regular size. To monitor blood pressure regularly at home. ICD-10 code: O0.90.  Dispense: 1 each; Refill: 0 - Glucose Tolerance, 2 Hours w/1 Hour - HIV Antibody (routine testing w rflx) - RPR - CBC - Comp Met (CMET) - Patient had elevated liver enzymes at MAU visit last week. Recommended that we recheck them  today.   2. Normal IUP (intrauterine pregnancy) on prenatal ultrasound, first trimester - Prenatal Vit-Fe Fumarate-FA (PREPLUS) 27-1 MG TABS; Take 1 tablet by mouth daily.  Dispense: 30 tablet; Refill: 11  3. Nausea and vomiting during pregnancy prior to [redacted] weeks gestation - promethazine (PHENERGAN) 6.25 MG/5ML syrup; Take 10 mLs (12.5 mg total) by mouth every 6 (six) hours as needed for nausea or vomiting.  Dispense: 120 mL; Refill: 1  Preterm labor symptoms and general obstetric precautions including but not limited to vaginal bleeding, contractions, leaking of fluid and fetal movement were reviewed in detail with the patient. Please refer to After Visit Summary for other counseling recommendations.   Return in about 4 weeks (around 06/12/2019) for virtual visit .  Future Appointments  Date Time Provider Department Center  06/11/2019  8:50 AM Dawson, Rolitta, CNM [facility] None    Heather Hogan DNP, CNM  05/15/19  9:45 AM       

## 2019-05-15 NOTE — Patient Instructions (Signed)
  05/15/19 9:42 AM   To whom it may concern,   It is ok for Pam Jacobson to reduce hours at work if needed.       Thressa Sheller DNP, CNM  05/15/19  9:43 AM

## 2019-05-16 LAB — COMPREHENSIVE METABOLIC PANEL
ALT: 34 IU/L — ABNORMAL HIGH (ref 0–32)
AST: 36 IU/L (ref 0–40)
Albumin/Globulin Ratio: 1.2 (ref 1.2–2.2)
Albumin: 3.8 g/dL — ABNORMAL LOW (ref 3.9–5.0)
Alkaline Phosphatase: 91 IU/L (ref 39–117)
BUN/Creatinine Ratio: 7 — ABNORMAL LOW (ref 9–23)
BUN: 4 mg/dL — ABNORMAL LOW (ref 6–20)
Bilirubin Total: 0.2 mg/dL (ref 0.0–1.2)
CO2: 20 mmol/L (ref 20–29)
Calcium: 9.3 mg/dL (ref 8.7–10.2)
Chloride: 102 mmol/L (ref 96–106)
Creatinine, Ser: 0.55 mg/dL — ABNORMAL LOW (ref 0.57–1.00)
GFR calc Af Amer: 155 mL/min/{1.73_m2} (ref >59)
GFR calc non Af Amer: 135 mL/min/{1.73_m2} (ref >59)
Globulin, Total: 3.1 g/dL (ref 1.5–4.5)
Glucose: 110 mg/dL — ABNORMAL HIGH (ref 65–99)
Potassium: 3.6 mmol/L (ref 3.5–5.2)
Sodium: 137 mmol/L (ref 134–144)
Total Protein: 6.9 g/dL (ref 6.0–8.5)

## 2019-05-16 LAB — HIV ANTIBODY (ROUTINE TESTING W REFLEX): HIV Screen 4th Generation wRfx: NONREACTIVE

## 2019-05-16 LAB — CBC
Hematocrit: 28.7 % — ABNORMAL LOW (ref 34.0–46.6)
Hemoglobin: 9.7 g/dL — ABNORMAL LOW (ref 11.1–15.9)
MCH: 29.5 pg (ref 26.6–33.0)
MCHC: 33.8 g/dL (ref 31.5–35.7)
MCV: 87 fL (ref 79–97)
Platelets: 251 10*3/uL (ref 150–450)
RBC: 3.29 x10E6/uL — ABNORMAL LOW (ref 3.77–5.28)
RDW: 11.2 % — ABNORMAL LOW (ref 11.7–15.4)
WBC: 9.9 10*3/uL (ref 3.4–10.8)

## 2019-05-16 LAB — GLUCOSE TOLERANCE, 2 HOURS W/ 1HR
Glucose, 1 hour: 114 mg/dL (ref 65–179)
Glucose, 2 hour: 99 mg/dL (ref 65–152)
Glucose, Fasting: 79 mg/dL (ref 65–91)

## 2019-05-16 LAB — RPR: RPR Ser Ql: NONREACTIVE

## 2019-05-17 MED ORDER — FERROUS GLUCONATE 324 (38 FE) MG PO TABS: 324.0000 mg | ORAL_TABLET | ORAL | 3 refills | Status: DC

## 2019-05-17 NOTE — Addendum Note (Signed)
Addended by: Thressa Sheller D on: 05/17/2019 08:17 AM   Modules accepted: Orders

## 2019-06-11 ENCOUNTER — Encounter: Payer: Self-pay | Admitting: Obstetrics and Gynecology

## 2019-06-11 ENCOUNTER — Other Ambulatory Visit: Payer: Self-pay

## 2019-06-11 ENCOUNTER — Ambulatory Visit (INDEPENDENT_AMBULATORY_CARE_PROVIDER_SITE_OTHER): Payer: Medicaid Other | Admitting: Obstetrics and Gynecology

## 2019-06-11 VITALS — BP 111/70 | HR 77 | Temp 98.5°F | Wt 154.2 lb

## 2019-06-11 DIAGNOSIS — Z3A31 31 weeks gestation of pregnancy: Secondary | ICD-10-CM

## 2019-06-11 DIAGNOSIS — O26893 Other specified pregnancy related conditions, third trimester: Secondary | ICD-10-CM

## 2019-06-11 DIAGNOSIS — N949 Unspecified condition associated with female genital organs and menstrual cycle: Secondary | ICD-10-CM

## 2019-06-11 DIAGNOSIS — Z3400 Encounter for supervision of normal first pregnancy, unspecified trimester: Secondary | ICD-10-CM

## 2019-06-11 DIAGNOSIS — R102 Pelvic and perineal pain: Secondary | ICD-10-CM

## 2019-06-11 MED ORDER — COMFORT FIT MATERNITY SUPP SM MISC: 1.0000 [IU] | Freq: Every day | 0 refills | Status: DC | PRN

## 2019-06-11 NOTE — Progress Notes (Signed)
   LOW-RISK PREGNANCY OFFICE VISIT Patient name: Pam Jacobson MRN [id number]  Date of birth: 01/02/1996 Chief Complaint:   Routine Prenatal Visit  History of Present Illness:   Pam Jacobson is a 24 y.o. G1P0 female at [redacted]w[redacted]d with an Estimated Date of Delivery: 08/07/19 being seen today for ongoing management of a low-risk pregnancy.  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 Contractions: Not present. Vag. Bleeding: None.  Movement: Present. denies leaking of fluid. Review of Systems:   Pertinent items are noted in HPI Denies abnormal vaginal discharge w/ itching/odor/irritation, headaches, visual changes, shortness of breath, chest pain, abdominal pain, severe nausea/vomiting, or problems with urination or bowel movements unless otherwise stated above. Pertinent History Reviewed:  Reviewed past medical,surgical, social, obstetrical and family history.  Reviewed problem list, medications and allergies. Physical Assessment:   Vitals:   06/11/19 0903  BP: 111/70  Pulse: 77  Temp: 98.5 F (36.9 C)  Weight: 154 lb 3.2 oz (69.9 kg)  Body mass index is 28.2 kg/m.        Physical Examination:   General appearance: Well appearing, and in no distress  Mental status: Alert, oriented to person, place, and time  Skin: Warm & dry  Cardiovascular: Normal heart rate noted  Respiratory: Normal respiratory effort, no distress  Abdomen: Soft, gravid, nontender  Pelvic: Cervical exam deferred         Extremities: Edema: None  Fetal Status: Fetal Heart Rate (bpm): 145 Fundal Height: 32 cm Movement: Present    No results found for this or any previous visit (from the past 24 hour(s)).  Assessment & Plan:  1) Low-risk pregnancy G1P0 at [redacted]w[redacted]d with an Estimated Date of Delivery: 08/07/19   2) Supervision of normal first pregnancy, antepartum - Anticipatory guidance for GBS and cervical check at nv - Continue taking iron supplements as  previously prescribed  3) Pelvic pressure in pregnancy, antepartum, third trimester  - Explained that some pressure is normal, but hers is more than likely worsened by her job - Rx for and instructions of where to pick up Elastic Bandages & Supports (COMFORT FIT MATERNITY SUPP SM) MISC   Meds: No orders of the defined types were placed in this encounter.  Labs/procedures today: none  Plan:  Continue routine obstetrical care   Reviewed: Preterm labor symptoms and general obstetric precautions including but not limited to vaginal bleeding, contractions, leaking of fluid and fetal movement were reviewed in detail with the patient.  All questions were answered. Has home bp cuff. Check bp weekly, let us know if >140/90.   Follow-up: Return in about 5 weeks (around 07/16/2019) for Return OB w/GBS.  No orders of the defined types were placed in this encounter.  Raelyn Mora MSN, CNM 06/11/2019 9:15 AM

## 2019-06-11 NOTE — Patient Instructions (Signed)

## 2019-06-14 ENCOUNTER — Encounter: Payer: Self-pay | Admitting: Obstetrics and Gynecology

## 2019-06-28 ENCOUNTER — Ambulatory Visit (INDEPENDENT_AMBULATORY_CARE_PROVIDER_SITE_OTHER)
Admission: RE | Admit: 2019-06-28 | Discharge: 2019-06-28 | Disposition: A | Discharge status: Home / Self Care | Payer: Medicaid Other | Source: Ambulatory Visit

## 2019-06-28 DIAGNOSIS — O479 False labor, unspecified: Secondary | ICD-10-CM | POA: Diagnosis not present

## 2019-06-28 NOTE — ED Provider Notes (Addendum)
Virtual Visit via Video Note:  Pam Jacobson  initiated request for Telemedicine visit with [facility] Urgent Care team. I connected with Pam Jacobson  on 06/28/2019 at 1:40 PM  for a synchronized telemedicine visit using a video enabled HIPPA compliant telemedicine application. I verified that I am speaking with Pam Jacobson  using two identifiers. Durward Parcel, FNP  was physically located in a [facility] Urgent care site and Pam Jacobson was located at a different location.   The limitations of evaluation and management by telemedicine as well as the availability of in-person appointments were discussed. Patient was informed that she  may incur a bill ( including co-pay) for this virtual visit encounter. Pam Jacobson  expressed understanding and gave verbal consent to proceed with virtual visit.     History of Present Illness:Pam Jacobson  is a 24 y.o. female who is [redacted] weeks pregnant presents via telehealth with a complaint of tightening around her lower abdomen last night.  Reports symptom has been resolved now.  States she has a rough time last night. Reports she can feel her baby close to her rib area, making it  difficult for her to breath.  Denies any precipitating event.  Denies similar symptoms in the past.  Denies chills, fever, nausea, vomiting, dehydration.  Past Medical History:  Diagnosis Date  . Medical history non-contributory     Allergies  Allergen Reactions  . Ibuprofen Itching and Nausea And Vomiting        Observations/Objective:  VITALS: Per patient if applicable, see vitals. GENERAL: Alert, appears well and in no acute distress. HEENT: Atraumatic, conjunctiva clear, no obvious abnormalities on inspection of external nose and ears. NECK: Normal movements of the head and neck. CARDIOPULMONARY: No increased WOB. Speaking in clear sentences. I:E ratio WNL.  MS: Moves all visible extremities without noticeable  abnormality. PSYCH: Pleasant and cooperative, well-groomed. Speech normal rate and rhythm. Affect is appropriate. Insight and judgement are appropriate. Attention is focused, linear, and appropriate.  NEURO: CN grossly intact. Oriented as arrived to appointment on time with no prompting. Moves both UE equally.  SKIN: No obvious lesions, wounds, erythema, or cyanosis noted on face or hands.     Assessment and Plan:   ICD-10-CM   1. Labor, false  O47.9    Symptoms are now resolved, which suggest it was a false labor.  Was advised to follow-up with OB/GYN. Increase fluid intake. To go to ED if symptoms return or is worsening.  Follow Up Instructions: Follow-up with OB/GYN tomorrow Go to ED for new or worsening symptom    I discussed the assessment and treatment plan with the patient. The patient was provided an opportunity to ask questions and all were answered. The patient agreed with the plan and demonstrated an understanding of the instructions.   The patient was advised to call back or seek an in-person evaluation if the symptoms worsen or if the condition fails to improve as anticipated.  I provided 15 minutes of non-face-to-face time during this encounter.      Durward Parcel, FNP 06/28/19 1430

## 2019-06-28 NOTE — Discharge Instructions (Addendum)
Education sheet regarding false contraction was attached/please reviewed Follow-up with OB/GYN tomorrow Increase fluid intake Seek emergent care for new or worsening symptoms

## 2019-07-16 ENCOUNTER — Ambulatory Visit (INDEPENDENT_AMBULATORY_CARE_PROVIDER_SITE_OTHER): Payer: Medicaid Other | Admitting: Obstetrics and Gynecology

## 2019-07-16 ENCOUNTER — Other Ambulatory Visit (HOSPITAL_COMMUNITY)
Admission: RE | Admit: 2019-07-16 | Discharge: 2019-07-16 | Disposition: A | Discharge status: Home / Self Care | Payer: Medicaid Other | Source: Ambulatory Visit | Attending: Obstetrics and Gynecology | Admitting: Obstetrics and Gynecology

## 2019-07-16 ENCOUNTER — Other Ambulatory Visit: Payer: Self-pay

## 2019-07-16 ENCOUNTER — Ambulatory Visit (INDEPENDENT_AMBULATORY_CARE_PROVIDER_SITE_OTHER): Payer: Medicaid Other | Admitting: Licensed Clinical Social Worker

## 2019-07-16 VITALS — BP 115/73 | HR 80 | Temp 97.8°F | Wt 164.6 lb

## 2019-07-16 DIAGNOSIS — Z3400 Encounter for supervision of normal first pregnancy, unspecified trimester: Secondary | ICD-10-CM | POA: Insufficient documentation

## 2019-07-16 DIAGNOSIS — Z3A36 36 weeks gestation of pregnancy: Secondary | ICD-10-CM

## 2019-07-16 DIAGNOSIS — Z789 Other specified health status: Secondary | ICD-10-CM

## 2019-07-16 DIAGNOSIS — Z23 Encounter for immunization: Secondary | ICD-10-CM | POA: Diagnosis not present

## 2019-07-16 DIAGNOSIS — Z3403 Encounter for supervision of normal first pregnancy, third trimester: Secondary | ICD-10-CM

## 2019-07-16 NOTE — Progress Notes (Signed)
   LOW-RISK PREGNANCY OFFICE VISIT Patient name: Pam Jacobson MRN [id number]  Date of birth: 19-Jun-1995 Chief Complaint:   Routine Prenatal Visit  History of Present Illness:   [name] is a 24 y.o. G1P0 female at [redacted]w[redacted]d with an Estimated Date of Delivery: 08/07/19 being seen today for ongoing management of a low-risk pregnancy.  Today she reports no complaints. She states she messaged for a telehealth visit on 06/28/19, because she kept vomiting and felt very weak. She [place]  [place] via [place]. She stated he told her it was false labor.  Contractions: Not present. Vag. Bleeding: None.  Movement: Present. denies leaking of fluid. Review of Systems:   Pertinent items are noted in HPI Denies abnormal vaginal discharge w/ itching/odor/irritation, headaches, visual changes, shortness of breath, chest pain, abdominal pain, severe nausea/vomiting, or problems with urination or bowel movements unless otherwise stated above. Pertinent History Reviewed:  Reviewed past medical,surgical, social, obstetrical and family history.  Reviewed problem list, medications and allergies. Physical Assessment:   Vitals:   07/16/19 0906  BP: 115/73  Pulse: 80  Temp: 97.8 F (36.6 C)  Weight: 164 lb 9.6 oz (74.7 kg)  Body mass index is 30.11 kg/m.        Physical Examination:   General appearance: Well appearing, and in no distress  Mental status: Alert, oriented to person, place, and time  Skin: Warm & dry  Cardiovascular: Normal heart rate noted  Respiratory: Normal respiratory effort, no distress  Abdomen: Soft, gravid, nontender  Pelvic: Cervical exam performed         Extremities: Edema: None  Fetal Status: Fetal Heart Rate (bpm): 161   Movement: Present    No results found for this or any previous visit (from the past 24 hour(s)).  Assessment & Plan:  1) Low-risk pregnancy G1P0 at [redacted]w[redacted]d with an Estimated Date of Delivery: 08/07/19   2) Supervision of  normal first pregnancy, antepartum  - Cytology - PAP( [place]),  - Cervicovaginal ancillary only( [place]),  - Culture, beta strep (group b only),  3) Need for tetanus, diphtheria, and acellular pertussis (Tdap) vaccine in patient of adolescent age or older  - Plan: Tdap vaccine greater than or equal to 7yo IM    Meds: No orders of the defined types were placed in this encounter.  Labs/procedures today: Tdap, GBS, GC/CT, Pap  Plan:  Continue routine obstetrical care   Reviewed: Preterm labor symptoms and general obstetric precautions including but not limited to vaginal bleeding, contractions, leaking of fluid and fetal movement were reviewed in detail with the patient.  All questions were answered. Has home bp cuff. Check bp weekly, let us know if >140/90.   Follow-up: Return in about 2 weeks (around 07/30/2019) for Return OB - My Chart video.  Orders Placed This Encounter  Procedures  . Culture, beta strep (group b only)  . Tdap vaccine greater than or equal to 7yo IM   [employer] MSN, CNM 07/16/2019

## 2019-07-16 NOTE — BH Specialist Note (Signed)
Integrated Behavioral Health Initial Visit  MRN: [id number] Name: SAADIYA WILFONG  Number of Integrated Behavioral Health Clinician visits:: 1 Session Start time:9:17am  Session End time: 9:40am Total time: 25 mins  Type of Service: Integrated Behavioral Health- Individual Interpretor:no  Interpretor Name and Language: None   Warm Hand Off Completed.       SUBJECTIVE: OLUWADARASIMI FAVOR is a 24 y.o. female accompanied by n/a Patient was referred by Darcella Cheshire RN for community resources  Patient reports the following symptoms/concerns: pt needs assistance with safe and affordable housing, wic appt scheduled and information provided about [facility]  Duration of problem: pregnancy; Severity of problem: mild  OBJECTIVE: Mood: good  and Affect: normal Risk of harm to self or others: No risk of harm to self or others.  LIFE CONTEXT: Family and Social: Lives in [place] with father of baby School/Work: [employer] in [place]  Self-Care: n/a Life Changes: n/a  GOALS ADDRESSED: Patient will: 1. Reduce symptoms of:  2. Increase knowledge and/or ability of:   3. Demonstrate ability to:   INTERVENTIONS: Interventions utilized: case management   Standardized Assessments completed: n/a  ASSESSMENT: Patient currently experiencing psychosocial stressors    Patient may benefit from case management with community resources. Called pt on mobile ph number listed left message regarding scheduled wic appt 07/17/2019 at 10am  PLAN: 1. Follow up with behavioral health clinician on : as needed  2. Behavioral recommendations: wic appt, contact affordable housing management ph number given to patient  3. Referral(s): [facility]  4. "From scale of 1-10, how likely are you to follow plan?":   Gwyndolyn Saxon, LCSW

## 2019-07-20 LAB — CERVICOVAGINAL ANCILLARY ONLY
Bacterial Vaginitis (gardnerella): NEGATIVE
Candida Glabrata: NEGATIVE
Candida Vaginitis: POSITIVE — AB
Chlamydia: NEGATIVE
Comment: NEGATIVE
Comment: NEGATIVE
Comment: NEGATIVE
Comment: NEGATIVE
Comment: NEGATIVE
Comment: NORMAL
Neisseria Gonorrhea: NEGATIVE
Trichomonas: NEGATIVE

## 2019-07-20 LAB — CYTOLOGY - PAP

## 2019-07-20 LAB — CULTURE, BETA STREP (GROUP B ONLY): Strep Gp B Culture: NEGATIVE

## 2019-07-30 ENCOUNTER — Other Ambulatory Visit: Payer: Self-pay

## 2019-07-30 ENCOUNTER — Encounter: Payer: Self-pay | Admitting: Obstetrics and Gynecology

## 2019-07-30 ENCOUNTER — Ambulatory Visit (INDEPENDENT_AMBULATORY_CARE_PROVIDER_SITE_OTHER): Payer: Medicaid Other | Admitting: Obstetrics and Gynecology

## 2019-07-30 VITALS — BP 129/77 | HR 97 | Temp 98.0°F | Wt 173.4 lb

## 2019-07-30 DIAGNOSIS — O26899 Other specified pregnancy related conditions, unspecified trimester: Secondary | ICD-10-CM

## 2019-07-30 DIAGNOSIS — R12 Heartburn: Secondary | ICD-10-CM

## 2019-07-30 DIAGNOSIS — Z3400 Encounter for supervision of normal first pregnancy, unspecified trimester: Secondary | ICD-10-CM

## 2019-07-30 MED ORDER — FAMOTIDINE 20 MG PO TABS: 20.0000 mg | ORAL_TABLET | Freq: Every day | ORAL | 2 refills | Status: DC

## 2019-07-30 NOTE — Progress Notes (Signed)
   LOW-RISK PREGNANCY OFFICE VISIT Patient name: Pam Jacobson MRN [id number]  Date of birth: [birth year] Chief Complaint:   Routine Prenatal Visit  History of Present Illness:   Pam Jacobson is a 24 y.o. G1P0 female at [redacted]w[redacted]d with an Estimated Date of Delivery: 08/07/19 being seen today for ongoing management of a low-risk pregnancy.  Today she reports no complaints. Contractions: Not present. Vag. Bleeding: None.  Movement: Present. denies leaking of fluid. Review of Systems:   Pertinent items are noted in HPI Denies abnormal vaginal discharge w/ itching/odor/irritation, headaches, visual changes, shortness of breath, chest pain, abdominal pain, severe nausea/vomiting, or problems with urination or bowel movements unless otherwise stated above. Pertinent History Reviewed:  Reviewed past medical,surgical, social, obstetrical and family history.  Reviewed problem list, medications and allergies. Physical Assessment:   Vitals:   07/30/19 1345  BP: 129/77  Pulse: 97  Temp: 98 F (36.7 C)  Weight: 173 lb 6.4 oz (78.7 kg)  Body mass index is 31.72 kg/m.        Physical Examination:   General appearance: Well appearing, and in no distress  Mental status: Alert, oriented to person, place, and time  Skin: Warm & dry  Cardiovascular: Normal heart rate noted  Respiratory: Normal respiratory effort, no distress  Abdomen: Soft, gravid, nontender  Pelvic: Cervical exam deferred         Extremities: Edema: None  Fetal Status: Fetal Heart Rate (bpm): 122 Fundal Height: 36 cm Movement: Present Presentation: Vertex  No results found for this or any previous visit (from the past 24 hour(s)).  Assessment & Plan:  1) Low-risk pregnancy G1P0 at [redacted]w[redacted]d with an Estimated Date of Delivery: 08/07/19   2) Supervision of normal first pregnancy, antepartum - Anticipatory guidance for labor - RTO next week for cervical exam  3) Heartburn during pregnancy, antepartum  - Rx for famotidine  (PEPCID) 20 MG tablet,    Meds:  Meds ordered this encounter  Medications  . DISCONTD: famotidine (PEPCID) 20 MG tablet    Sig: Take 1 tablet (20 mg total) by mouth daily. May increase to twice daily if needed.    Dispense:  30 tablet    Refill:  2  . famotidine (PEPCID) 20 MG tablet    Sig: Take 1 tablet (20 mg total) by mouth daily. May increase to twice daily if needed.    Dispense:  30 tablet    Refill:  2   Labs/procedures today: none  Plan:  Continue routine obstetrical care   Reviewed: Term labor symptoms and general obstetric precautions including but not limited to vaginal bleeding, contractions, leaking of fluid and fetal movement were reviewed in detail with the patient.  All questions were answered. Has home bp cuff. Check bp weekly, let us know if >140/90.   Follow-up: Return in about 1 week (around 08/06/2019) for Return OB visit.  No orders of the defined types were placed in this encounter.  Raelyn Mora MSN, CNM 07/30/2019 3:43 PM

## 2019-07-30 NOTE — Patient Instructions (Signed)

## 2019-08-05 ENCOUNTER — Encounter: Payer: Medicaid Other | Admitting: Obstetrics and Gynecology

## 2019-08-07 ENCOUNTER — Inpatient Hospital Stay (HOSPITAL_COMMUNITY): Admission: RE | Admit: 2019-08-07 | Payer: Medicaid Other | Source: Home / Self Care

## 2020-03-17 ENCOUNTER — Emergency Department (HOSPITAL_COMMUNITY)
Admission: EM | Admit: 2020-03-17 | Discharge: 2020-03-17 | Disposition: A | Discharge status: Home / Self Care | Payer: Medicaid Other | Attending: Emergency Medicine | Admitting: Emergency Medicine

## 2020-03-17 ENCOUNTER — Other Ambulatory Visit: Payer: Self-pay

## 2020-03-17 DIAGNOSIS — R109 Unspecified abdominal pain: Secondary | ICD-10-CM | POA: Insufficient documentation

## 2020-03-17 DIAGNOSIS — R1110 Vomiting, unspecified: Secondary | ICD-10-CM | POA: Diagnosis not present

## 2020-03-17 DIAGNOSIS — Z5321 Procedure and treatment not carried out due to patient leaving prior to being seen by health care provider: Secondary | ICD-10-CM | POA: Diagnosis not present

## 2020-03-17 LAB — CBC
HCT: 43.3 % (ref 36.0–46.0)
Hemoglobin: 13.9 g/dL (ref 12.0–15.0)
MCH: 29 pg (ref 26.0–34.0)
MCHC: 32.1 g/dL (ref 30.0–36.0)
MCV: 90.4 fL (ref 80.0–100.0)
Platelets: 361 10*3/uL (ref 150–400)
RBC: 4.79 MIL/uL (ref 3.87–5.11)
RDW: 12.2 % (ref 11.5–15.5)
WBC: 6.4 10*3/uL (ref 4.0–10.5)
nRBC: 0 % (ref 0.0–0.2)

## 2020-03-17 LAB — COMPREHENSIVE METABOLIC PANEL
ALT: 207 U/L — ABNORMAL HIGH (ref 0–44)
AST: 302 U/L — ABNORMAL HIGH (ref 15–41)
Albumin: 4.9 g/dL (ref 3.5–5.0)
Alkaline Phosphatase: 106 U/L (ref 38–126)
Anion gap: 10 (ref 5–15)
BUN: 8 mg/dL (ref 6–20)
CO2: 25 mmol/L (ref 22–32)
Calcium: 9.7 mg/dL (ref 8.9–10.3)
Chloride: 103 mmol/L (ref 98–111)
Creatinine, Ser: 0.8 mg/dL (ref 0.44–1.00)
GFR, Estimated: >60 mL/min (ref >60)
Glucose, Bld: 111 mg/dL — ABNORMAL HIGH (ref 70–99)
Potassium: 3.5 mmol/L (ref 3.5–5.1)
Sodium: 138 mmol/L (ref 135–145)
Total Bilirubin: 1.3 mg/dL — ABNORMAL HIGH (ref 0.3–1.2)
Total Protein: 8.9 g/dL — ABNORMAL HIGH (ref 6.5–8.1)

## 2020-03-17 LAB — I-STAT BETA HCG BLOOD, ED (MC, WL, AP ONLY): I-stat hCG, quantitative: <5 m[IU]/mL (ref <5)

## 2020-03-17 LAB — LIPASE, BLOOD: Lipase: 331 U/L — ABNORMAL HIGH (ref 11–51)

## 2020-03-17 NOTE — ED Notes (Signed)
No answer from pt when called for room x 3.  

## 2020-03-17 NOTE — ED Triage Notes (Signed)
Pt states that she has had N/V and abdominal pain since yesterday. Alert and oriented.

## 2020-03-17 NOTE — ED Notes (Signed)
No answer from pt when called for room x 2.  

## 2020-04-13 ENCOUNTER — Encounter: Payer: Self-pay | Admitting: Emergency Medicine

## 2020-04-13 ENCOUNTER — Other Ambulatory Visit: Payer: Self-pay

## 2020-04-13 ENCOUNTER — Ambulatory Visit
Admission: EM | Admit: 2020-04-13 | Discharge: 2020-04-13 | Disposition: A | Discharge status: Home / Self Care | Payer: Medicaid Other | Attending: Urgent Care | Admitting: Urgent Care

## 2020-04-13 DIAGNOSIS — B349 Viral infection, unspecified: Secondary | ICD-10-CM | POA: Diagnosis not present

## 2020-04-13 DIAGNOSIS — R52 Pain, unspecified: Secondary | ICD-10-CM

## 2020-04-13 DIAGNOSIS — R0981 Nasal congestion: Secondary | ICD-10-CM

## 2020-04-13 DIAGNOSIS — Z1152 Encounter for screening for COVID-19: Secondary | ICD-10-CM

## 2020-04-13 LAB — POCT URINALYSIS DIP (MANUAL ENTRY)
Bilirubin, UA: NEGATIVE
Blood, UA: NEGATIVE
Glucose, UA: NEGATIVE mg/dL
Ketones, POC UA: NEGATIVE mg/dL
Leukocytes, UA: NEGATIVE
Nitrite, UA: NEGATIVE
Protein Ur, POC: NEGATIVE mg/dL
Spec Grav, UA: 1.02 (ref 1.010–1.025)
Urobilinogen, UA: 0.2 E.U./dL
pH, UA: 7 (ref 5.0–8.0)

## 2020-04-13 MED ORDER — ACETAMINOPHEN 325 MG PO TABS
650.0000 mg | ORAL_TABLET | Freq: Once | ORAL | Status: AC
  Administered 2020-04-13: 650 mg via ORAL

## 2020-04-13 MED ORDER — CETIRIZINE HCL 10 MG PO TABS: 10.0000 mg | ORAL_TABLET | Freq: Every day | ORAL | 0 refills | Status: DC

## 2020-04-13 MED ORDER — PSEUDOEPHEDRINE HCL 60 MG PO TABS: 60.0000 mg | ORAL_TABLET | Freq: Three times a day (TID) | ORAL | 0 refills | Status: DC | PRN

## 2020-04-13 NOTE — Discharge Instructions (Addendum)

## 2020-04-13 NOTE — ED Triage Notes (Signed)
Pt said yesterday she started having back and neck and leg aches. Pt said fevers, and feeling bad. Just feels tired

## 2020-04-13 NOTE — ED Provider Notes (Signed)
[facility]   MRN: [id number] DOB: August 30, 1995  Subjective:   Pam Jacobson is a 25 y.o. female presenting for 1 day history of acute onset body aches, thigh pain, back pain, sinus congestion. No COVID vaccination. No asthma. No history of lung disorders or smoking.   No current facility-administered medications for this encounter.  Current Outpatient Medications:  .  Blood Pressure Monitoring (BLOOD PRESSURE MONITOR AUTOMAT) DEVI, 1 Device by Does not apply route daily. Automatic blood pressure cuff regular size. To monitor blood pressure regularly at home. ICD-10 code: O0.90., Disp: 1 each, Rfl: 0 .  Elastic Bandages & Supports (COMFORT FIT MATERNITY SUPP SM) MISC, 1 Units by Does not apply route daily as needed., Disp: 1 each, Rfl: 0 .  famotidine (PEPCID) 20 MG tablet, Take 1 tablet (20 mg total) by mouth daily. May increase to twice daily if needed., Disp: 30 tablet, Rfl: 2 .  ferrous gluconate (FERGON) 324 MG tablet, Take 1 tablet (324 mg total) by mouth every other day., Disp: 30 tablet, Rfl: 3 .  Prenatal Vit-Fe Fumarate-FA (PREPLUS) 27-1 MG TABS, Take 1 tablet by mouth daily., Disp: 30 tablet, Rfl: 11 .  promethazine (PHENERGAN) 6.25 MG/5ML syrup, Take 10 mLs (12.5 mg total) by mouth every 6 (six) hours as needed for nausea or vomiting., Disp: 120 mL, Rfl: 1   Allergies  Allergen Reactions  . Ibuprofen Itching and Nausea And Vomiting    Past Medical History:  Diagnosis Date  . Medical history non-contributory      Past Surgical History:  Procedure Laterality Date  . NO PAST SURGERIES      Family History  Problem Relation Age of Onset  . Healthy Mother   . Healthy Father     Social History   Tobacco Use  . Smoking status: Never Smoker  . Smokeless tobacco: Never Used  Vaping Use  . Vaping Use: Never used  Substance Use Topics  . Alcohol use: Never  . Drug use: Never    ROS   Objective:   Vitals: BP 127/81 (BP Location: Right Arm)    Pulse 99   Temp (!) 102.4 F (39.1 C) (Oral)   Resp 18   LMP 03/26/2020 (Exact Date)   SpO2 98%   Physical Exam Constitutional:      General: She is not in acute distress.    Appearance: Normal appearance. She is well-developed. She is not ill-appearing, toxic-appearing or diaphoretic.  HENT:     Head: Normocephalic and atraumatic.     Nose: Nose normal.     Mouth/Throat:     Mouth: Mucous membranes are moist.  Eyes:     Extraocular Movements: Extraocular movements intact.     Pupils: Pupils are equal, round, and reactive to light.  Cardiovascular:     Rate and Rhythm: Normal rate and regular rhythm.     Pulses: Normal pulses.     Heart sounds: Normal heart sounds. No murmur heard. No friction rub. No gallop.   Pulmonary:     Effort: Pulmonary effort is normal. No respiratory distress.     Breath sounds: Normal breath sounds. No stridor. No wheezing, rhonchi or rales.  Skin:    General: Skin is warm and dry.     Findings: No rash.  Neurological:     Mental Status: She is alert and oriented to person, place, and time.  Psychiatric:        Mood and Affect: Mood normal.  Behavior: Behavior normal.        Thought Content: Thought content normal.     Results for orders placed or performed during the hospital encounter of 04/13/20 (from the past 24 hour(s))  POCT urinalysis dipstick     Status: None   Collection Time: 04/13/20  6:24 PM  Result Value Ref Range   Color, UA yellow yellow   Clarity, UA clear clear   Glucose, UA negative negative mg/dL   Bilirubin, UA negative negative   Ketones, POC UA negative negative mg/dL   Spec Grav, UA 8.127 5.170 - 1.025   Blood, UA negative negative   pH, UA 7.0 5.0 - 8.0   Protein Ur, POC negative negative mg/dL   Urobilinogen, UA 0.2 0.2 or 1.0 E.U./dL   Nitrite, UA Negative Negative   Leukocytes, UA Negative Negative    Assessment and Plan :   PDMP not reviewed this encounter.  1. Viral syndrome   2. Encounter for  screening for COVID-19   3. Body aches   4. Nasal congestion     Will manage for viral illness such as viral URI, viral syndrome, viral rhinitis, high suspicion for COVID-19. Counseled patient on nature of COVID-19 including modes of transmission, diagnostic testing, management and supportive care.  Offered scripts for symptomatic relief. COVID 19 testing is pending. Counseled patient on potential for adverse effects with medications prescribed/recommended today, ER and return-to-clinic precautions discussed, patient verbalized understanding.     Wallis Bamberg, [place] 04/13/20 0174

## 2020-04-15 LAB — NOVEL CORONAVIRUS, NAA: SARS-CoV-2, NAA: DETECTED — AB

## 2020-04-15 LAB — SARS-COV-2, NAA 2 DAY TAT

## 2020-04-25 ENCOUNTER — Other Ambulatory Visit (HOSPITAL_BASED_OUTPATIENT_CLINIC_OR_DEPARTMENT_OTHER): Payer: Self-pay | Admitting: Internal Medicine

## 2020-04-25 ENCOUNTER — Ambulatory Visit: Payer: Medicaid Other | Attending: Internal Medicine

## 2020-04-25 DIAGNOSIS — Z23 Encounter for immunization: Secondary | ICD-10-CM

## 2020-04-25 MED FILL — MODERNA COVID-19 VACCINE 10: 100 | 28 days supply | Qty: 1 | Fill #0

## 2020-04-25 NOTE — Progress Notes (Signed)
   Covid-19 Vaccination Clinic  Name:  Pam Jacobson    MRN: [id number] DOB: Dec 06, 1995  04/25/2020  [name] was observed post Covid-19 immunization for 15 minutes without incident. She was provided with Vaccine Information Sheet and instruction to access the V-Safe system.   [name] was instructed to call 911 with any severe reactions post vaccine: Marland Kitchen Difficulty breathing  . Swelling of face and throat  . A fast heartbeat  . A bad rash all over body  . Dizziness and weakness   Immunizations Administered    Name Date Dose VIS Date Route   Moderna COVID-19 Vaccine 04/25/2020 10:26 AM 0.5 mL 01/06/2020 Intramuscular   Manufacturer: Moderna   Lot: [id number]   NDC: 87681-157-26

## 2020-05-23 ENCOUNTER — Ambulatory Visit: Payer: Self-pay

## 2020-05-27 ENCOUNTER — Ambulatory Visit: Payer: Medicaid Other

## 2020-06-24 ENCOUNTER — Ambulatory Visit: Payer: Medicaid Other | Attending: Internal Medicine

## 2020-06-24 DIAGNOSIS — Z23 Encounter for immunization: Secondary | ICD-10-CM

## 2020-06-27 NOTE — Progress Notes (Signed)
   Covid-19 Vaccination Clinic  Name:  Pam Jacobson    MRN: [id number] DOB: 05/23/1995  06/27/2020  [name] was observed post Covid-19 immunization for 15 minutes without incident. She was provided with Vaccine Information Sheet and instruction to access the V-Safe system.   [name] was instructed to call 911 with any severe reactions post vaccine: Marland Kitchen Difficulty breathing  . Swelling of face and throat  . A fast heartbeat  . A bad rash all over body  . Dizziness and weakness   Immunizations Administered    Name Date Dose VIS Date Route   Moderna COVID-19 Vaccine 06/24/2020  1:53 PM 0.5 mL 01/06/2020 Intramuscular   Manufacturer: Gala Murdoch   Lot: [id number]   NDC: 24401-027-25

## 2020-07-01 ENCOUNTER — Other Ambulatory Visit (HOSPITAL_BASED_OUTPATIENT_CLINIC_OR_DEPARTMENT_OTHER): Payer: Self-pay

## 2020-07-01 MED ORDER — COVID-19 MRNA VACC (MODERNA) 100 MCG/0.5ML IM SUSP
INTRAMUSCULAR | 0 refills | Status: DC
  Filled 2020-07-01: qty 0.5, 1d supply, fill #0

## 2020-11-02 ENCOUNTER — Encounter (HOSPITAL_BASED_OUTPATIENT_CLINIC_OR_DEPARTMENT_OTHER): Payer: Self-pay

## 2020-11-02 ENCOUNTER — Other Ambulatory Visit: Payer: Self-pay

## 2020-11-02 ENCOUNTER — Emergency Department (HOSPITAL_BASED_OUTPATIENT_CLINIC_OR_DEPARTMENT_OTHER): Payer: Medicaid Other

## 2020-11-02 ENCOUNTER — Emergency Department (HOSPITAL_BASED_OUTPATIENT_CLINIC_OR_DEPARTMENT_OTHER)
Admission: EM | Admit: 2020-11-02 | Discharge: 2020-11-02 | Disposition: A | Discharge status: Home / Self Care | Payer: Medicaid Other | Attending: Emergency Medicine | Admitting: Emergency Medicine

## 2020-11-02 DIAGNOSIS — R1032 Left lower quadrant pain: Secondary | ICD-10-CM | POA: Insufficient documentation

## 2020-11-02 DIAGNOSIS — R1084 Generalized abdominal pain: Secondary | ICD-10-CM

## 2020-11-02 DIAGNOSIS — K529 Noninfective gastroenteritis and colitis, unspecified: Secondary | ICD-10-CM

## 2020-11-02 DIAGNOSIS — R1012 Left upper quadrant pain: Secondary | ICD-10-CM | POA: Insufficient documentation

## 2020-11-02 DIAGNOSIS — R112 Nausea with vomiting, unspecified: Secondary | ICD-10-CM

## 2020-11-02 DIAGNOSIS — R1031 Right lower quadrant pain: Secondary | ICD-10-CM | POA: Diagnosis present

## 2020-11-02 LAB — URINALYSIS, MICROSCOPIC (REFLEX)

## 2020-11-02 LAB — COMPREHENSIVE METABOLIC PANEL
ALT: 20 U/L (ref 0–44)
AST: 24 U/L (ref 15–41)
Albumin: 4 g/dL (ref 3.5–5.0)
Alkaline Phosphatase: 67 U/L (ref 38–126)
Anion gap: 10 (ref 5–15)
BUN: 5 mg/dL — ABNORMAL LOW (ref 6–20)
CO2: 26 mmol/L (ref 22–32)
Calcium: 9.2 mg/dL (ref 8.9–10.3)
Chloride: 102 mmol/L (ref 98–111)
Creatinine, Ser: 0.67 mg/dL (ref 0.44–1.00)
GFR, Estimated: >60 mL/min (ref >60)
Glucose, Bld: 101 mg/dL — ABNORMAL HIGH (ref 70–99)
Potassium: 3.6 mmol/L (ref 3.5–5.1)
Sodium: 138 mmol/L (ref 135–145)
Total Bilirubin: 0.4 mg/dL (ref 0.3–1.2)
Total Protein: 8.3 g/dL — ABNORMAL HIGH (ref 6.5–8.1)

## 2020-11-02 LAB — URINALYSIS, ROUTINE W REFLEX MICROSCOPIC
Bilirubin Urine: NEGATIVE
Glucose, UA: NEGATIVE mg/dL
Ketones, ur: NEGATIVE mg/dL
Nitrite: NEGATIVE
Protein, ur: NEGATIVE mg/dL
Specific Gravity, Urine: 1.015 (ref 1.005–1.030)
pH: 6 (ref 5.0–8.0)

## 2020-11-02 LAB — LIPASE, BLOOD: Lipase: 28 U/L (ref 11–51)

## 2020-11-02 LAB — CBC
HCT: 33 % — ABNORMAL LOW (ref 36.0–46.0)
Hemoglobin: 11 g/dL — ABNORMAL LOW (ref 12.0–15.0)
MCH: 28.8 pg (ref 26.0–34.0)
MCHC: 33.3 g/dL (ref 30.0–36.0)
MCV: 86.4 fL (ref 80.0–100.0)
Platelets: 348 10*3/uL (ref 150–400)
RBC: 3.82 MIL/uL — ABNORMAL LOW (ref 3.87–5.11)
RDW: 12.3 % (ref 11.5–15.5)
WBC: 8.6 10*3/uL (ref 4.0–10.5)
nRBC: 0 % (ref 0.0–0.2)

## 2020-11-02 LAB — PREGNANCY, URINE: Preg Test, Ur: NEGATIVE

## 2020-11-02 MED ORDER — IOHEXOL 300 MG/ML  SOLN
75.0000 mL | Freq: Once | INTRAMUSCULAR | Status: AC | PRN
  Administered 2020-11-02: 75 mL via INTRAVENOUS

## 2020-11-02 MED ORDER — ONDANSETRON HCL 4 MG/2ML IJ SOLN
4.0000 mg | Freq: Once | INTRAMUSCULAR | Status: AC
  Administered 2020-11-02: 4 mg via INTRAVENOUS
  Filled 2020-11-02: qty 2

## 2020-11-02 MED ORDER — ONDANSETRON 4 MG PO TBDP: 4.0000 mg | ORAL_TABLET | Freq: Three times a day (TID) | ORAL | 0 refills | Status: DC | PRN

## 2020-11-02 MED ORDER — HYDROCODONE-ACETAMINOPHEN 5-325 MG PO TABS: 1.0000 | ORAL_TABLET | ORAL | 0 refills | Status: DC | PRN

## 2020-11-02 MED ORDER — AMOXICILLIN-POT CLAVULANATE 400-57 MG/5ML PO SUSR: 400.0000 mg | Freq: Two times a day (BID) | ORAL | 0 refills | Status: AC

## 2020-11-02 MED ORDER — SODIUM CHLORIDE 0.9 % IV BOLUS
1000.0000 mL | Freq: Once | INTRAVENOUS | Status: AC
  Administered 2020-11-02: 1000 mL via INTRAVENOUS

## 2020-11-02 NOTE — Discharge Instructions (Addendum)
Your history, exam, work-up today are consistent with an enteritis or inflammation of your bowel.  The CT scan showed these findings with did not show evidence of appendicitis at this time.  As you are able to eat and drink after the nausea medicine, we feel is reasonable to let you go home with prescription for pain medicine, nausea medicine, and antibiotics.  Please rest and stay hydrated.  If any symptoms change or worsen acutely, please return to the nearest emergency department.  Please follow-up with your primary doctor.

## 2020-11-02 NOTE — ED Notes (Signed)
fluids and crackers given, will see if pt can tolerate

## 2020-11-02 NOTE — ED Provider Notes (Signed)
Care assumed from Dr. Gwenlyn Fudge.  At time of transfer care, patient is waiting for results of CT abdomen pelvis to look for significant abnormality causing her abdominal pain prior to likely discharge.  CT scan returned showing enteritis likely causing her symptoms.  Given her discomfort, plan of care will be to prescribe antibiotics, pain medicine, nausea medicine and if she passes p.o. challenge be able to discharge her home for outpatient follow-up.  Patient was able to pass p.o. challenge after Zofran.  Patient given written for antibiotics, pain medicine, nausea medicine and understands return precautions.  She no questions or concerns and was discharged in good condition.    Clinical Impression: 1. Enteritis   2. Generalized abdominal pain   3. Non-intractable vomiting with nausea, unspecified vomiting type     Disposition: Discharge  Condition: Good  I have discussed the results, Dx and Tx plan with the pt(& family if present). He/she/they expressed understanding and agree(s) with the plan. Discharge instructions discussed at great length. Strict return precautions discussed and pt &/or family have verbalized understanding of the instructions. No further questions at time of discharge.    New Prescriptions   AMOXICILLIN-CLAVULANATE (AUGMENTIN) 400-57 MG/5ML SUSPENSION    Take 5 mLs (400 mg total) by mouth 2 (two) times daily for 7 days.   HYDROCODONE-ACETAMINOPHEN (NORCO/VICODIN) 5-325 MG TABLET    Take 1 tablet by mouth every 4 (four) hours as needed.   ONDANSETRON (ZOFRAN ODT) 4 MG DISINTEGRATING TABLET    Take 1 tablet (4 mg total) by mouth every 8 (eight) hours as needed for nausea or vomiting.    Follow Up: No follow-up provider specified.      Xavyer Steenson, Canary Brim, MD 11/03/20 [phone]

## 2020-11-02 NOTE — ED Provider Notes (Signed)
[facility] EMERGENCY DEPARTMENT Provider Note   CSN: [id number] Arrival date & time: 11/02/20  1115     History Chief Complaint  Patient presents with   Abdominal Pain    Pam Jacobson is a 25 y.o. female.  HPI 25 year old female presents with abdominal pain.  It is mostly upper abdominal pain though some lower.  Has been on and off for about a week but since yesterday has been a lot more consistent and more severe.  She had some vomiting a couple days ago but none since.  She does not really have any diarrhea but is having a little bit of looser stools but still only once a day.  No fevers but some low back pain.  Has taken Tylenol with some partial relief.  The pain is currently milder and about a 5 out of 10.  Past Medical History:  Diagnosis Date   Medical history non-contributory     Patient Active Problem List   Diagnosis Date Noted   Supervision of normal first pregnancy, antepartum 01/12/2019    Past Surgical History:  Procedure Laterality Date   NO PAST SURGERIES       OB History     Gravida  1   Para      Term      Preterm      AB      Living         SAB      IAB      Ectopic      Multiple      Live Births              Family History  Problem Relation Age of Onset   Healthy Mother    Healthy Father     Social History   Tobacco Use   Smoking status: Never   Smokeless tobacco: Never  Vaping Use   Vaping Use: Never used  Substance Use Topics   Alcohol use: Never   Drug use: Never    Home Medications Prior to Admission medications   Medication Sig Start Date End Date Taking? Authorizing Provider  Blood Pressure Monitoring (BLOOD PRESSURE MONITOR AUTOMAT) DEVI 1 Device by Does not apply route daily. Automatic blood pressure cuff regular size. To monitor blood pressure regularly at home. ICD-10 code: O0.90. 05/15/19   Reva Bores, MD  cetirizine (ZYRTEC ALLERGY) 10 MG tablet Take 1 tablet (10 mg total) by  mouth daily. 04/13/20   Wallis Bamberg, PA-C  COVID-19 mRNA vaccine, Moderna, 100 MCG/0.5ML injection INJECT AS DIRECTED 04/25/20 04/25/21  Judyann Munson, MD  COVID-19 mRNA vaccine, Moderna, 100 MCG/0.5ML injection Inject into the muscle. 06/24/20   Judyann Munson, MD  Elastic Bandages & Supports (COMFORT FIT MATERNITY SUPP SM) MISC 1 Units by Does not apply route daily as needed. 06/11/19   Raelyn Mora, CNM  famotidine (PEPCID) 20 MG tablet Take 1 tablet (20 mg total) by mouth daily. May increase to twice daily if needed. 07/30/19   Raelyn Mora, CNM  ferrous gluconate (FERGON) 324 MG tablet Take 1 tablet (324 mg total) by mouth every other day. 05/17/19   Armando Reichert, CNM  Prenatal Vit-Fe Fumarate-FA (PREPLUS) 27-1 MG TABS Take 1 tablet by mouth daily. 05/15/19   Reva Bores, MD  promethazine (PHENERGAN) 6.25 MG/5ML syrup Take 10 mLs (12.5 mg total) by mouth every 6 (six) hours as needed for nausea or vomiting. 05/15/19   Reva Bores, MD  pseudoephedrine (SUDAFED) 60  MG tablet Take 1 tablet (60 mg total) by mouth every 8 (eight) hours as needed for congestion. 04/13/20   Wallis Bamberg, PA-C    Allergies    Ibuprofen  Review of Systems   Review of Systems  Constitutional:  Negative for fever.  Gastrointestinal:  Positive for abdominal pain and vomiting. Negative for diarrhea.  Genitourinary:  Positive for vaginal bleeding (currently on period).  Musculoskeletal:  Positive for back pain.  All other systems reviewed and are negative.  Physical Exam Updated Vital Signs BP 132/76 (BP Location: Right Arm)   Pulse 87   Temp 98.9 F (37.2 C) (Oral)   Resp 18   Ht 5\' 4"  (1.626 m)   Wt 63.5 kg   LMP 10/28/2020   SpO2 100%   BMI 24.03 kg/m   Physical Exam Vitals and nursing note reviewed.  Constitutional:      Appearance: She is well-developed.  HENT:     Head: Normocephalic and atraumatic.     Right Ear: External ear normal.     Left Ear: External ear normal.     Nose: Nose  normal.  Eyes:     General:        Right eye: No discharge.        Left eye: No discharge.  Cardiovascular:     Rate and Rhythm: Normal rate and regular rhythm.     Heart sounds: Normal heart sounds.  Pulmonary:     Effort: Pulmonary effort is normal.     Breath sounds: Normal breath sounds.  Abdominal:     Palpations: Abdomen is soft.     Tenderness: There is abdominal tenderness in the right lower quadrant, left upper quadrant and left lower quadrant. There is no right CVA tenderness or left CVA tenderness.  Skin:    General: Skin is warm and dry.  Neurological:     Mental Status: She is alert.  Psychiatric:        Mood and Affect: Mood is not anxious.    ED Results / Procedures / Treatments   Labs (all labs ordered are listed, but only abnormal results are displayed) Labs Reviewed  COMPREHENSIVE METABOLIC PANEL - Abnormal; Notable for the following components:      Result Value   Glucose, Bld 101 (*)    BUN 5 (*)    Total Protein 8.3 (*)    All other components within normal limits  CBC - Abnormal; Notable for the following components:   RBC 3.82 (*)    Hemoglobin 11.0 (*)    HCT 33.0 (*)    All other components within normal limits  URINALYSIS, ROUTINE W REFLEX MICROSCOPIC - Abnormal; Notable for the following components:   APPearance CLOUDY (*)    Hgb urine dipstick LARGE (*)    Leukocytes,Ua SMALL (*)    All other components within normal limits  URINALYSIS, MICROSCOPIC (REFLEX) - Abnormal; Notable for the following components:   Bacteria, UA FEW (*)    All other components within normal limits  LIPASE, BLOOD  PREGNANCY, URINE    EKG None  Radiology No results found.  Procedures Procedures   Medications Ordered in ED Medications  sodium chloride 0.9 % bolus 1,000 mL (has no administration in time range)    ED Course  I have reviewed the triage vital signs and the nursing notes.  Pertinent labs & imaging results that were available during my care  of the patient were reviewed by me and considered in my medical decision  making (see chart for details).    MDM Rules/Calculators/A&P                           Given length of time of symptoms and tenderness with worsening pain, will get CT. Labs are benign. She otherwise appears well. Care to Dr. Rush Landmark. Final Clinical Impression(s) / ED Diagnoses Final diagnoses:  None    Rx / DC Orders ED Discharge Orders     None        Pricilla Loveless, MD 11/02/20 1553

## 2020-11-02 NOTE — ED Triage Notes (Signed)
Pt c/o abd pain x 1 week-with n/v/d-NAD-steady gait

## 2021-01-30 IMAGING — US US OB < 14 WEEKS - US OB TV
1 series · 15 of 28 positions shown · non-contrast
Comparison: None.

CLINICAL DATA: Pregnant patient with abdominal pain.

EXAM:
OBSTETRIC <14 WK US AND TRANSVAGINAL OB US
TECHNIQUE: Both transabdominal and transvaginal ultrasound examinations were
performed for complete evaluation of the gestation as well as the
maternal uterus, adnexal regions, and pelvic cul-de-sac.
Transvaginal technique was performed to assess early pregnancy.

[Series 1: us ob < 14 weeks - us ob tv · 15 of 78 slices shown]
[im 1/78]
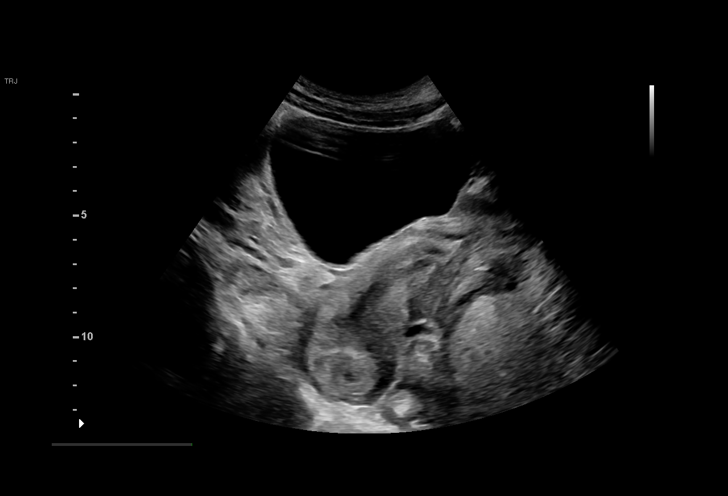
[im 6/78]
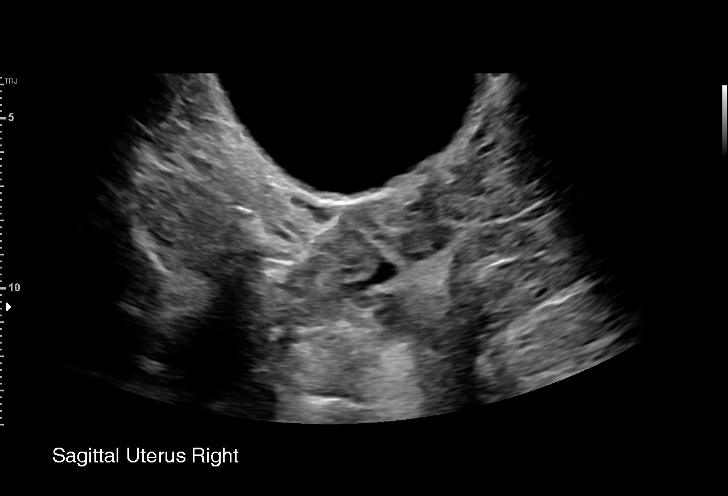
[im 12/78]
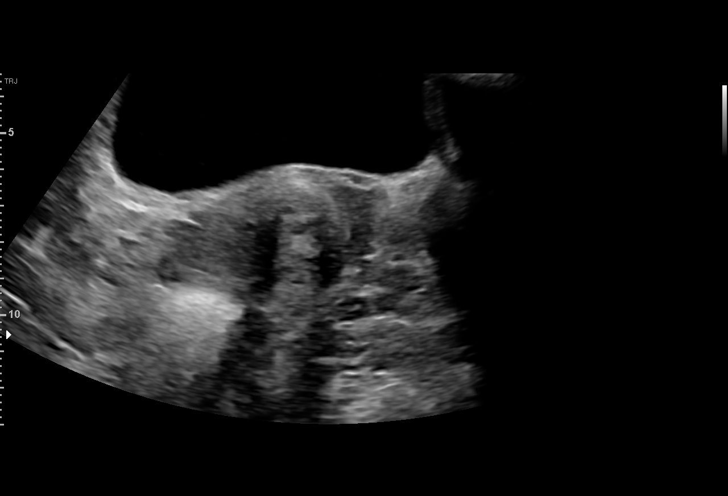
[im 18/78]
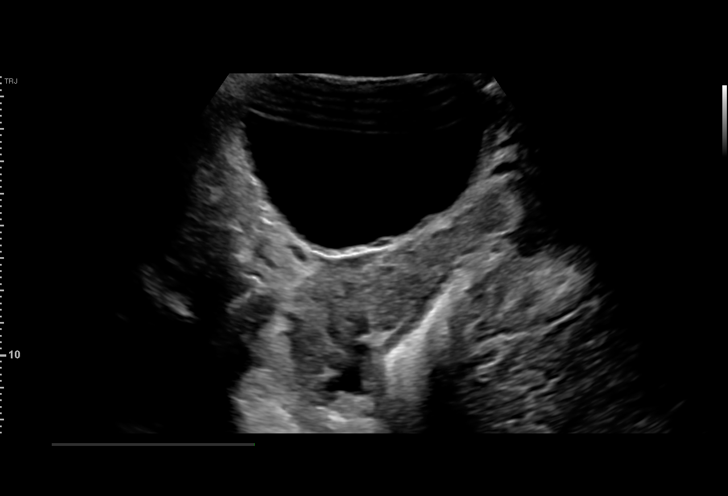
[im 23/78]
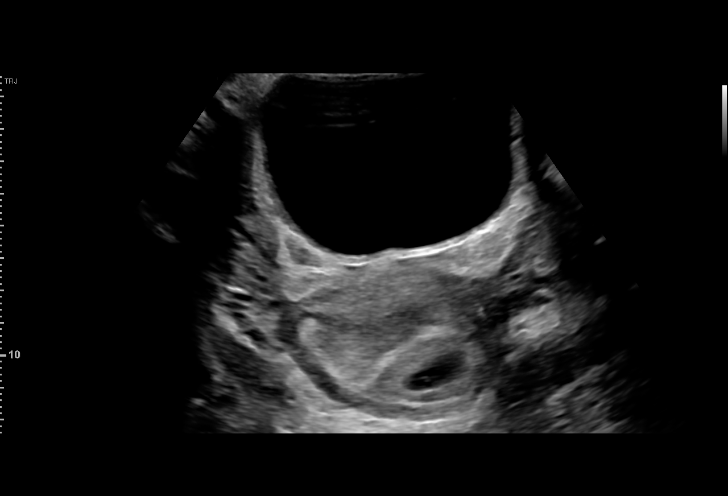
[im 29/78]
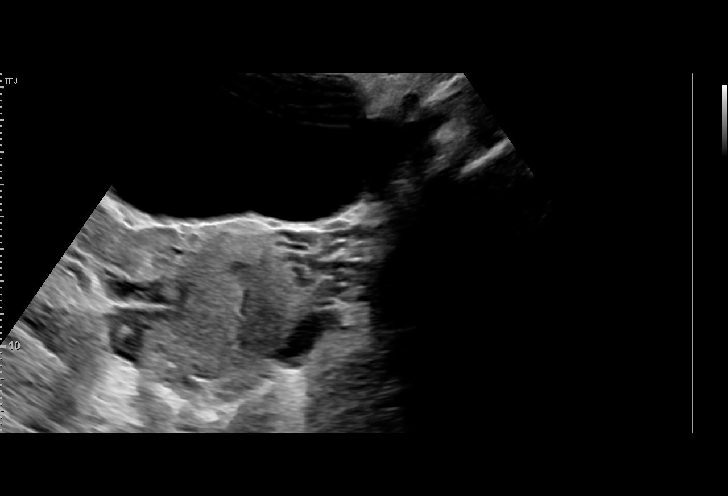
[im 35/78]
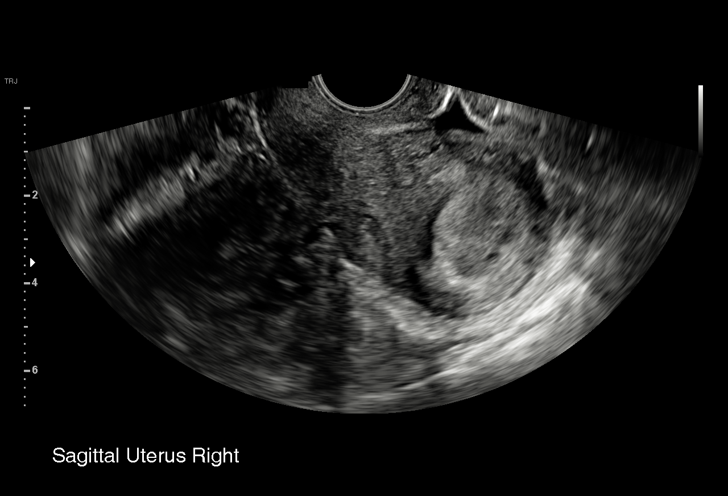
[im 40/78]
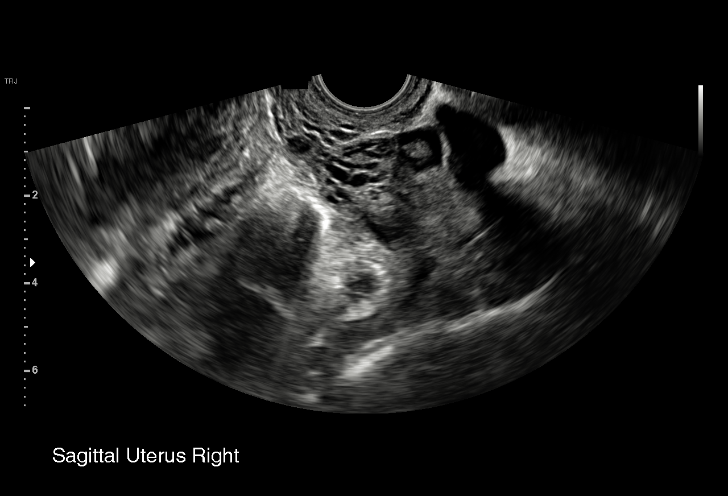
[im 43/78]
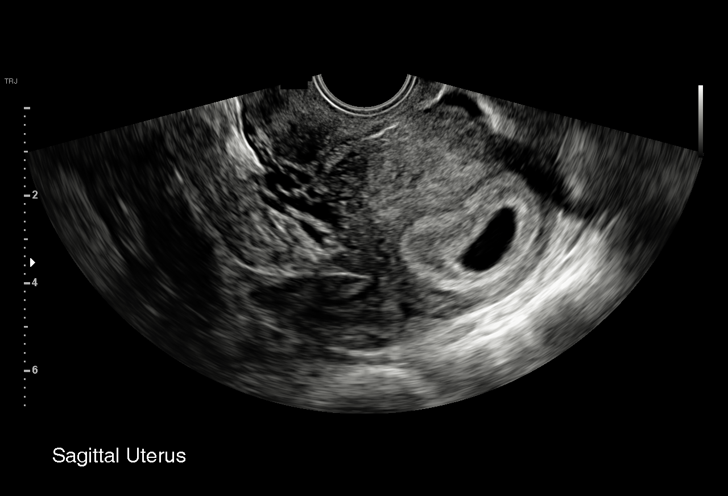
[im 49/78]
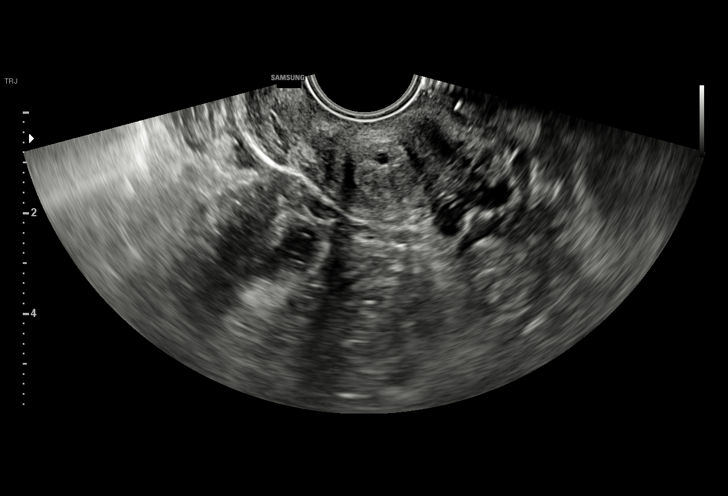
[im 55/78]
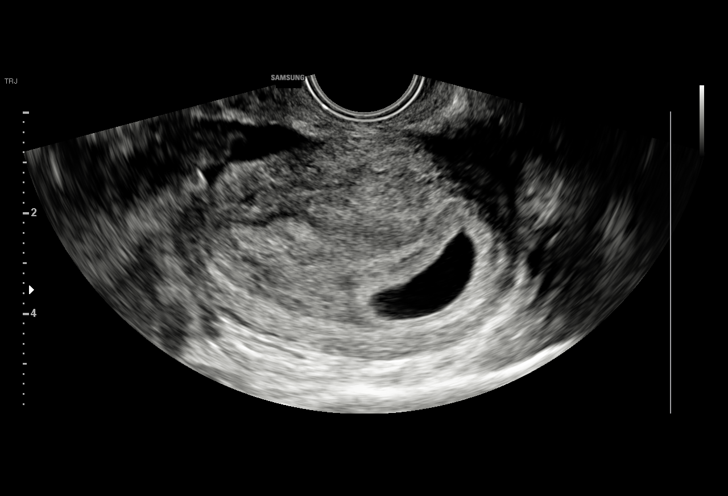
[im 60/78]
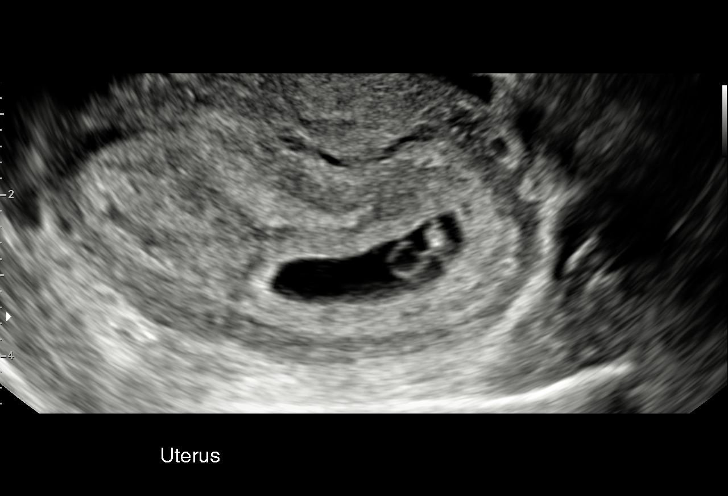
[im 66/78]
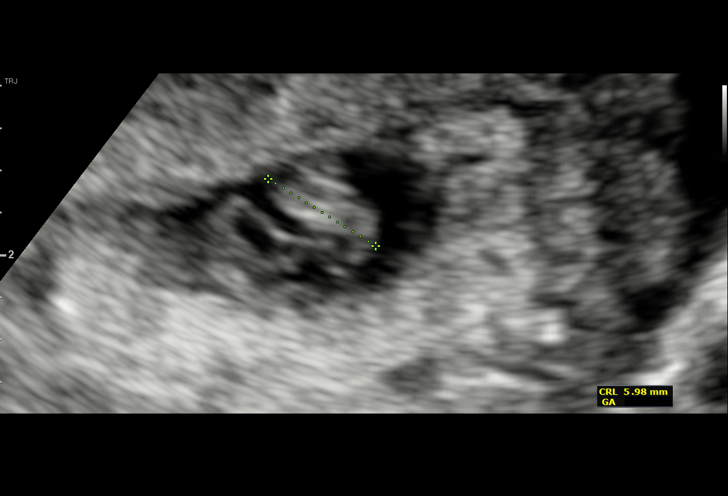
[im 72/78]
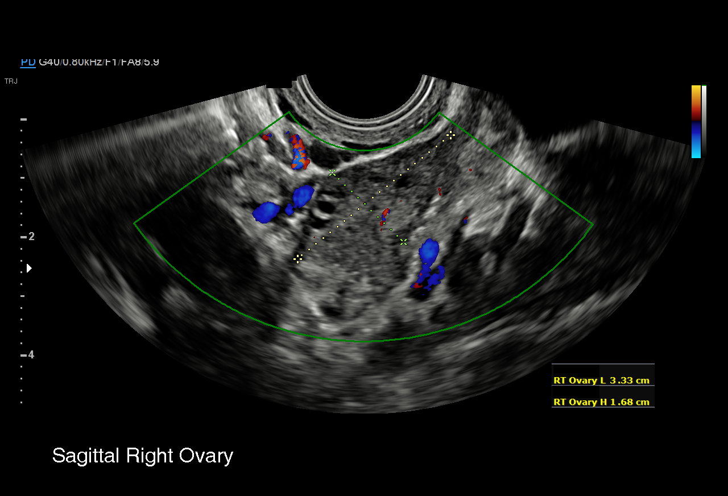
[im 78/78]
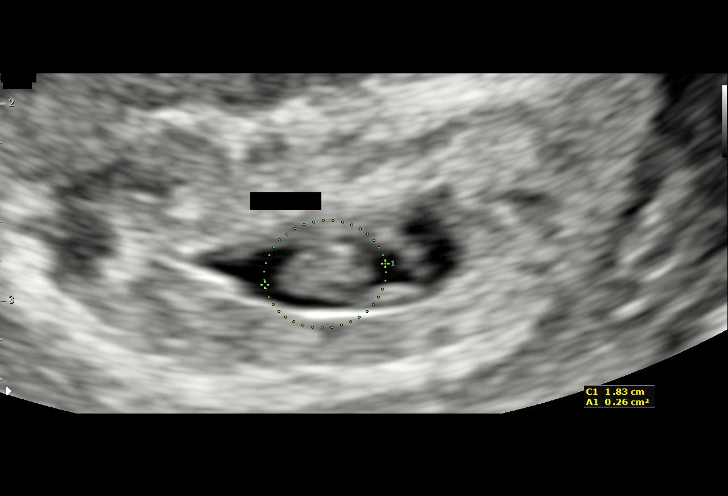

[15 of 28 positions shown; findings below may reference images not displayed]

FINDINGS: Intrauterine gestational sac: Single

Yolk sac:  Visualized.

Embryo:  Visualized.

Cardiac Activity: Visualized.

Heart Rate: 114 bpm

CRL:  5.7 mm   6 w   2 d                  US EDC: 08/08/2019

Subchorionic hemorrhage: Adjacent to the right aspect of the
gestational sac, within the endometrial canal, is a 1.2 x 0.6 x
cm fluid collection.

Maternal uterus/adnexae: Normal right and left ovaries. Small amount
of free fluid in the pelvis.
IMPRESSION: Single live intrauterine gestation.

Adjacent to the gestational sac, within the endometrium, is a small
focal area of fluid. This may represent an evolving adjacent
subchorionic hemorrhage or potentially an additional adjacent
gestational sac without yolk sac. Recommend attention on follow-up
pelvic ultrasound in 10-14 days for definitive characterization.

## 2022-12-19 IMAGING — CT CT ABD-PELV W/ CM
2 of 4 series · 16 of 46 positions shown, 18 images · IV contrast (Omnipaque)
Comparison: None.

CLINICAL DATA: One week history of nausea, vomiting and abdominal
pain.

EXAM:
CT ABDOMEN AND PELVIS WITH CONTRAST
TECHNIQUE: Multidetector CT imaging of the abdomen and pelvis was performed
using the standard protocol following bolus administration of
intravenous contrast.
CONTRAST:  75mL OMNIPAQUE IOHEXOL 300 MG/ML  SOLN

[Series 2: axial st · axial · 0.64mm/px · z∈[-505,-130]mm · 13 of 83 slices shown, 15 images]
[im 4/83  soft-tissue]
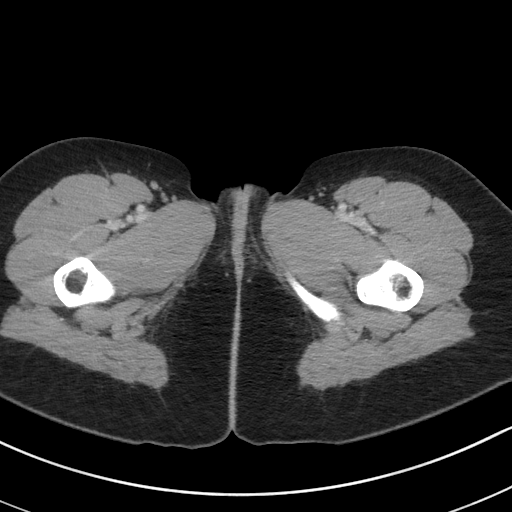
[im 4/83  bone]
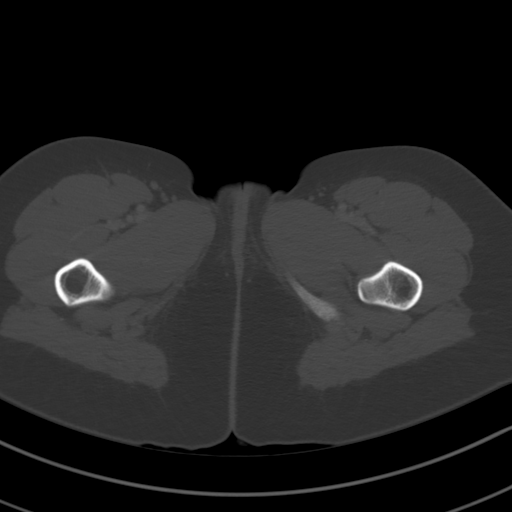
[im 10/83  soft-tissue]
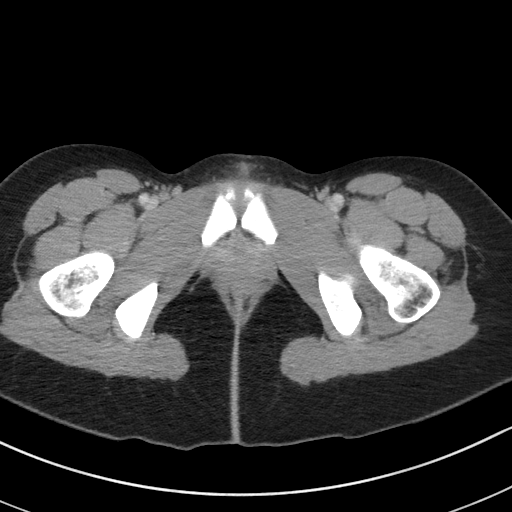
[im 17/83  soft-tissue]
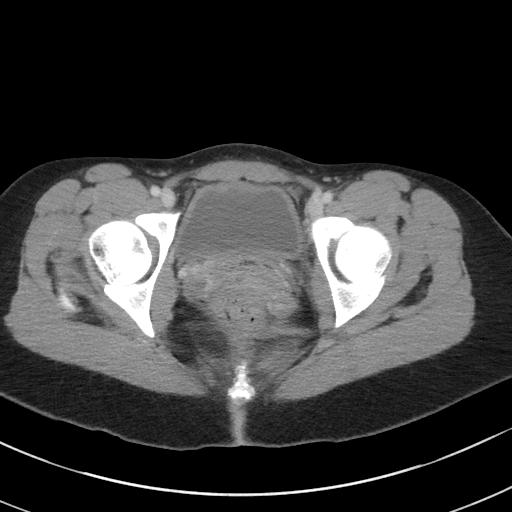
[im 23/83  soft-tissue]
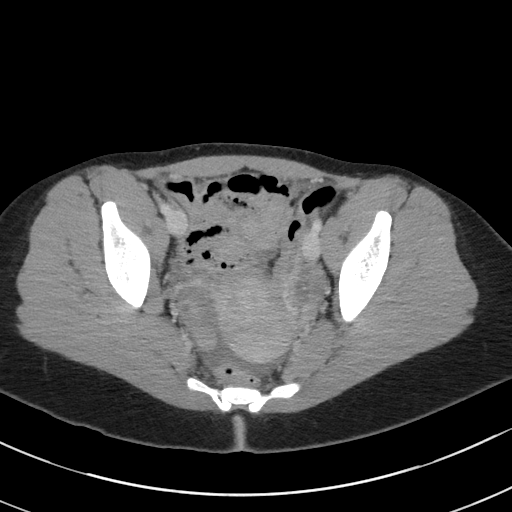
[im 30/83  soft-tissue]
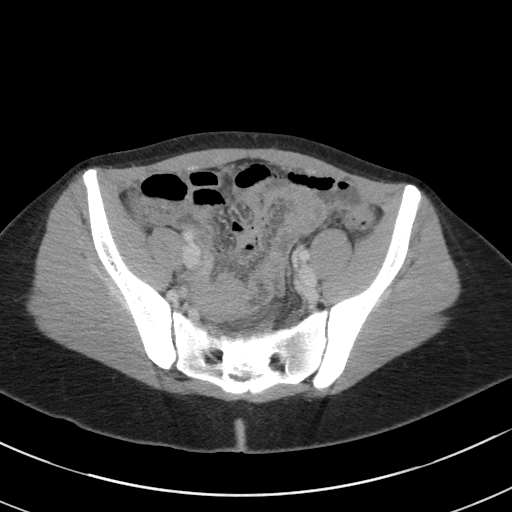
[im 37/83  soft-tissue]
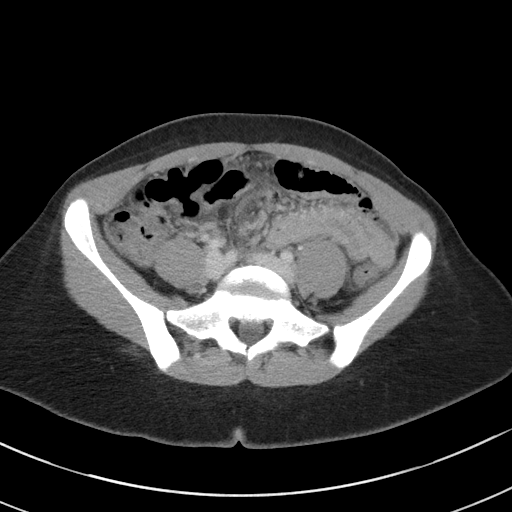
[im 43/83  soft-tissue]
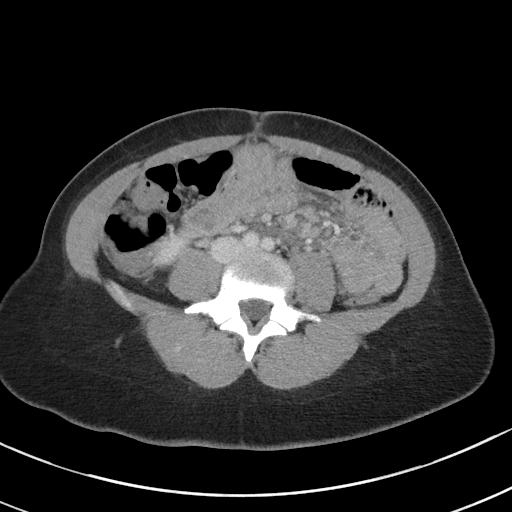
[im 46/83  soft-tissue]
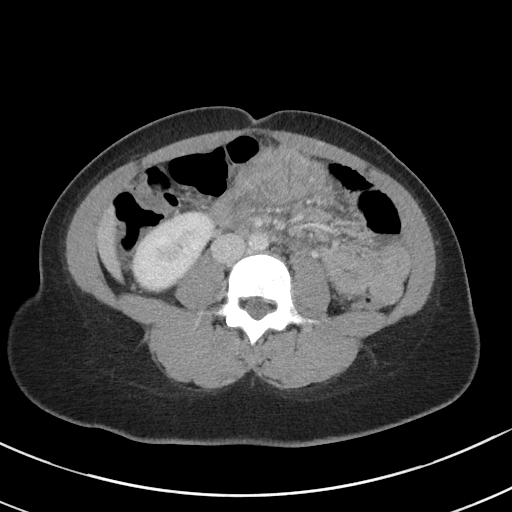
[im 53/83  soft-tissue]
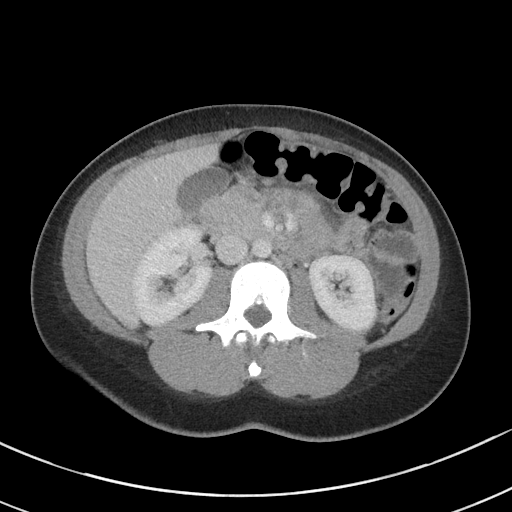
[im 53/83  bone]
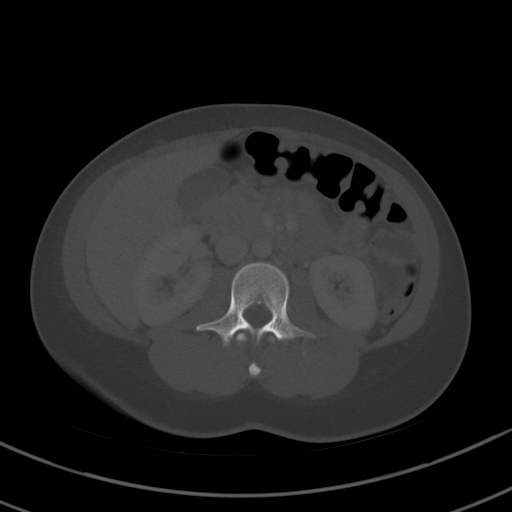
[im 60/83  soft-tissue]
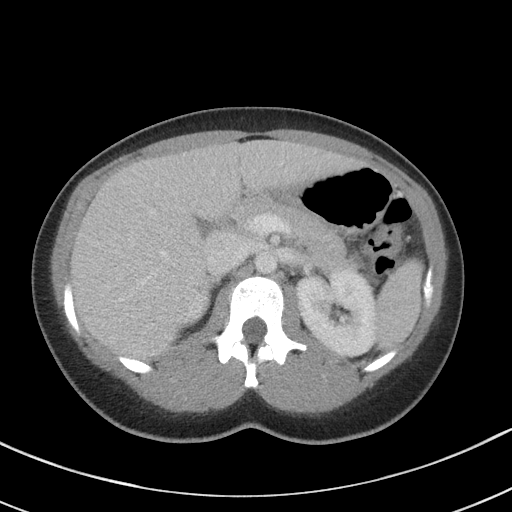
[im 66/83  soft-tissue]
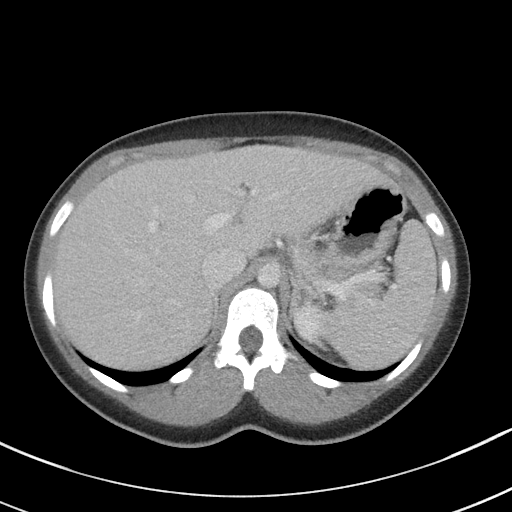
[im 73/83  soft-tissue]
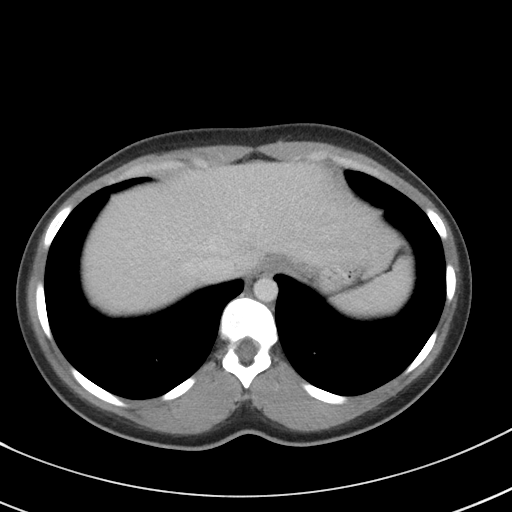
[im 79/83  soft-tissue]
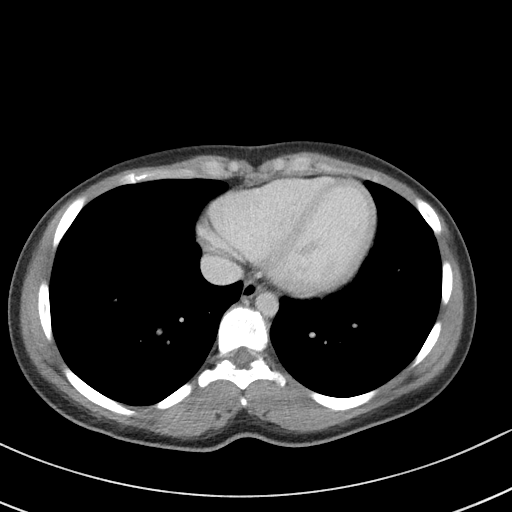

[Series 5: coronal st · coronal · 0.66mm/px · 3 of 74 slices shown]
[im 25/74  soft-tissue]
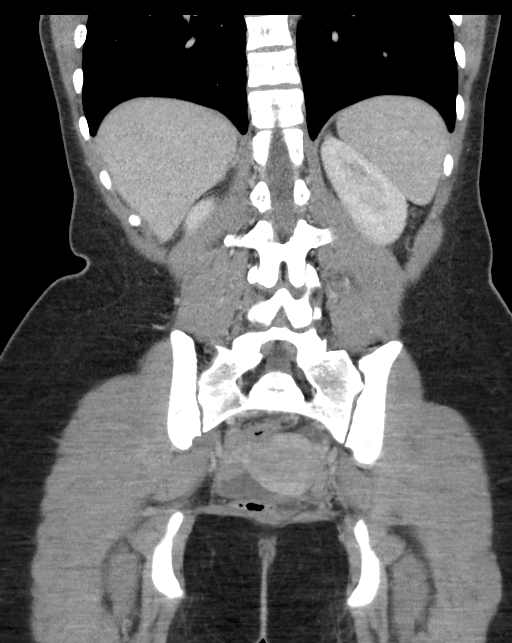
[im 33/74  soft-tissue]
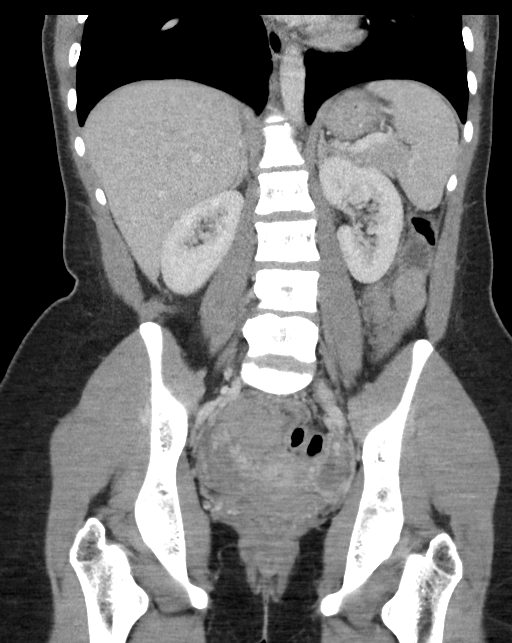
[im 41/74  soft-tissue]
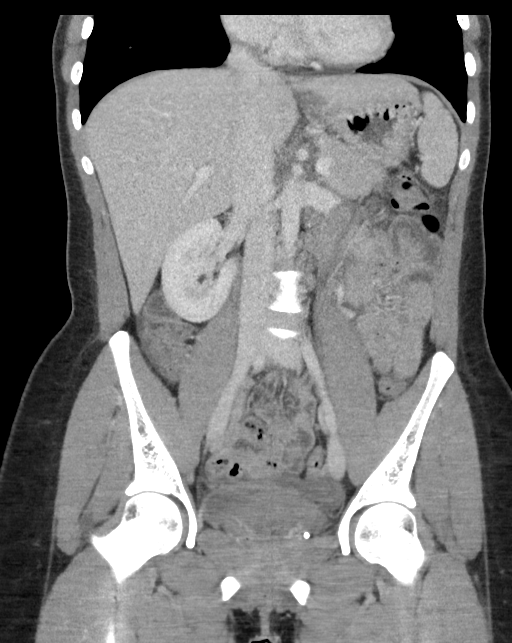

[16 of 46 positions shown; findings below may reference images not displayed]

FINDINGS: Lower chest: Insert lung bases

Hepatobiliary: No hepatic lesions or intrahepatic biliary
dilatation. The gallbladder is unremarkable. No common bile duct
dilatation.

Pancreas: No mass, inflammation or ductal dilatation.

Spleen: Normal size.  No focal lesions.

Adrenals/Urinary Tract: Adrenal glands and kidneys are unremarkable.
The bladder is unremarkable.

Stomach/Bowel: The stomach is grossly. The duodenum is unremarkable.
There are inflamed appearing small bowel loops with wall thickening,
mucosal fold thickening and enhancement. There is also mesenteric
edema and prominent appearing vessels and numerous scattered lymph
nodes. Findings suggest inflammatory or infectious enteritis. The
colon is unremarkable. No evidence of colonic inflammatory process
and no mass lesions or obstructive findings. Difficult to identified
the terminal ileum and appendix.

Vascular/Lymphatic: The aorta and branch vessels are patent. The
major venous structures are patent. No mesenteric or retroperitoneal
mass or overt adenopathy. Numerous small likely reactive lymph nodes
mainly in the mesentery.

Reproductive: The uterus is retroverted. The ovaries are grossly
normal.

Other: Small amount of free pelvic fluid is noted. No abdominal or
pelvic free air.

Musculoskeletal: No significant bony findings.
IMPRESSION: 1. CT findings consistent with inflammatory or infectious enteritis
as detailed above.
2. No other significant abdominal/pelvic findings.

## 2023-09-02 ENCOUNTER — Encounter (HOSPITAL_COMMUNITY): Payer: Self-pay | Admitting: *Deleted

## 2023-09-02 ENCOUNTER — Inpatient Hospital Stay (HOSPITAL_COMMUNITY)
Admission: AD | Admit: 2023-09-02 | Discharge: 2023-09-02 | Discharge status: Against Medical Advice | Attending: Obstetrics and Gynecology | Admitting: Obstetrics and Gynecology

## 2023-09-02 DIAGNOSIS — Z3A01 Less than 8 weeks gestation of pregnancy: Secondary | ICD-10-CM | POA: Diagnosis not present

## 2023-09-02 DIAGNOSIS — O219 Vomiting of pregnancy, unspecified: Secondary | ICD-10-CM | POA: Diagnosis present

## 2023-09-02 NOTE — MAU Note (Signed)
 Pam Jacobson is a 28 y.o. at Unknown here in MAU reporting: has been trying to eat, anytime she tries, she gags and it just comes back up.  Is able to drink. Had appt last wk at [facility].  Was told [redacted]w[redacted]d, saw preg. No problems, wasn't having the nausea.  No bleeding.  Has had a little pain, LUQ. LMP: 4/30 Onset of complaint: yesterday Pain score: mild Vitals:   09/02/23 1452  BP: 114/71  Pulse: 60  Resp: 17  Temp: 98.6 F (37 C)  SpO2: 100%      Lab orders placed from triage:  UA, UPT    Pt not actively vomiting in triage

## 2023-09-02 NOTE — MAU Note (Signed)
 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  Explained visitation policy,. Asked if there was someone she could call to come get child as some testing and treatment may  be limited. Pt stated, I should just go and come back later.  I said are you sure and she said, no and walked out

## 2023-09-03 ENCOUNTER — Inpatient Hospital Stay (HOSPITAL_COMMUNITY)
Admission: AD | Admit: 2023-09-03 | Discharge: 2023-09-03 | Disposition: A | Discharge status: Home / Self Care | Payer: Self-pay | Attending: Obstetrics and Gynecology | Admitting: Obstetrics and Gynecology

## 2023-09-03 ENCOUNTER — Inpatient Hospital Stay (HOSPITAL_COMMUNITY)

## 2023-09-03 ENCOUNTER — Encounter (HOSPITAL_COMMUNITY): Payer: Self-pay | Admitting: *Deleted

## 2023-09-03 DIAGNOSIS — O208 Other hemorrhage in early pregnancy: Secondary | ICD-10-CM | POA: Insufficient documentation

## 2023-09-03 DIAGNOSIS — N8312 Corpus luteum cyst of left ovary: Secondary | ICD-10-CM | POA: Diagnosis not present

## 2023-09-03 DIAGNOSIS — B9689 Other specified bacterial agents as the cause of diseases classified elsewhere: Secondary | ICD-10-CM | POA: Insufficient documentation

## 2023-09-03 DIAGNOSIS — O26891 Other specified pregnancy related conditions, first trimester: Secondary | ICD-10-CM

## 2023-09-03 DIAGNOSIS — A599 Trichomoniasis, unspecified: Secondary | ICD-10-CM | POA: Diagnosis not present

## 2023-09-03 DIAGNOSIS — R102 Pelvic and perineal pain: Secondary | ICD-10-CM | POA: Diagnosis not present

## 2023-09-03 DIAGNOSIS — Z3A01 Less than 8 weeks gestation of pregnancy: Secondary | ICD-10-CM | POA: Diagnosis not present

## 2023-09-03 DIAGNOSIS — O3481 Maternal care for other abnormalities of pelvic organs, first trimester: Secondary | ICD-10-CM | POA: Diagnosis not present

## 2023-09-03 DIAGNOSIS — O23591 Infection of other part of genital tract in pregnancy, first trimester: Secondary | ICD-10-CM | POA: Insufficient documentation

## 2023-09-03 LAB — URINALYSIS, ROUTINE W REFLEX MICROSCOPIC
Bilirubin Urine: NEGATIVE
Glucose, UA: NEGATIVE mg/dL
Hgb urine dipstick: NEGATIVE
Ketones, ur: 5 mg/dL — AB
Nitrite: NEGATIVE
Protein, ur: NEGATIVE mg/dL
Specific Gravity, Urine: 1.006 (ref 1.005–1.030)
pH: 7 (ref 5.0–8.0)

## 2023-09-03 LAB — POCT PREGNANCY, URINE: Preg Test, Ur: POSITIVE — AB

## 2023-09-03 LAB — CBC
HCT: 38.5 % (ref 36.0–46.0)
Hemoglobin: 12.7 g/dL (ref 12.0–15.0)
MCH: 28.2 pg (ref 26.0–34.0)
MCHC: 33 g/dL (ref 30.0–36.0)
MCV: 85.4 fL (ref 80.0–100.0)
Platelets: 346 10*3/uL (ref 150–400)
RBC: 4.51 MIL/uL (ref 3.87–5.11)
RDW: 13.3 % (ref 11.5–15.5)
WBC: 6.6 10*3/uL (ref 4.0–10.5)
nRBC: 0 % (ref 0.0–0.2)

## 2023-09-03 LAB — ABO/RH: ABO/RH(D): O POS

## 2023-09-03 LAB — WET PREP, GENITAL
Sperm: NONE SEEN
WBC, Wet Prep HPF POC: >=10 — AB (ref <10)
Yeast Wet Prep HPF POC: NONE SEEN

## 2023-09-03 LAB — HCG, QUANTITATIVE, PREGNANCY: hCG, Beta Chain, Quant, S: 89399 m[IU]/mL — ABNORMAL HIGH (ref <5)

## 2023-09-03 MED ORDER — ONDANSETRON 4 MG PO TBDP
8.0000 mg | ORAL_TABLET | Freq: Once | ORAL | Status: AC
  Administered 2023-09-03: 8 mg via ORAL
  Filled 2023-09-03: qty 2

## 2023-09-03 MED ORDER — ONDANSETRON 4 MG PO TBDP: 4.0000 mg | ORAL_TABLET | Freq: Three times a day (TID) | ORAL | 0 refills | Status: DC | PRN

## 2023-09-03 MED ORDER — PREPLUS 27-1 MG PO TABS: 1.0000 | ORAL_TABLET | Freq: Every day | ORAL | 13 refills | Status: DC

## 2023-09-03 MED ORDER — METRONIDAZOLE 500 MG PO TABS: 500.0000 mg | ORAL_TABLET | Freq: Two times a day (BID) | ORAL | 0 refills | Status: DC

## 2023-09-03 MED ORDER — FLUCONAZOLE 150 MG PO TABS: 150.0000 mg | ORAL_TABLET | Freq: Every day | ORAL | 1 refills | Status: DC

## 2023-09-03 NOTE — MAU Note (Signed)
 Pam Jacobson is a 28 y.o. at [redacted]w[redacted]d here in MAU reporting: n/v x 3-4 days and mild abd pain . Denies any vag bleeding or discharge.   LMP: 07/17/2023 Onset of complaint: 3-4days Pain score: 4 Vitals:   09/03/23 1346  BP: 119/70  Pulse: 68  Resp: 18  Temp: 98.6 F (37 C)     FHT: n/a  Lab orders placed from triage: u/a, UPT, wet prep, gc.

## 2023-09-03 NOTE — MAU Provider Note (Signed)
 N/V and Mild Abd pain    S [name] is a 28 y.o. G2P0 pregnant female at [redacted]w[redacted]d who presents to MAU today with complaint of N/V for 3-4 days and mild abdominal pain. Denies VB or discharge.  Positive Upreg here.    Pertinent items noted in HPI and remainder of comprehensive ROS otherwise negative.   O BP 119/70   Pulse 68   Temp 98.6 F (37 C)   Resp 18   Ht 5' 4 (1.626 m)   Wt 65.6 kg   LMP 07/17/2023   BMI 24.82 kg/m  Physical Exam Vitals and nursing note reviewed.  Constitutional:      General: She is not in acute distress.    Appearance: She is well-developed and normal weight. She is not ill-appearing.  HENT:     Head: Normocephalic and atraumatic.     Mouth/Throat:     Mouth: Mucous membranes are moist.     Pharynx: Oropharynx is clear.   Eyes:     Extraocular Movements: Extraocular movements intact.    Cardiovascular:     Rate and Rhythm: Normal rate.  Pulmonary:     Effort: Pulmonary effort is normal. No respiratory distress.  Abdominal:     General: Abdomen is flat. There is no distension.     Palpations: Abdomen is soft.     Tenderness: There is no abdominal tenderness.   Skin:    General: Skin is warm and dry.   Neurological:     Mental Status: She is alert and oriented to person, place, and time.     Motor: No weakness.   Psychiatric:        Mood and Affect: Mood normal.        Behavior: Behavior normal.      MDM: MAU Course: Upreg positive, pt reports IUP seen on [facility]  a couple weeks ago but no access to that report.  New abdominal tenderness.  Will repeat US  to confirm given new abdominal cramping.  Lab work for unknown pregnancy location.  Zofran  for nausea.    hCG 16109 ABO O+ CBC hgb 12.7 w/o leukocytosis Wet Prep with trichomonas and clue cells * UA with large LE, rare bacteria, negative nitrites Sending UA for OB Ucx GC collected   US  = IUP 6wk5d, small SCH  AP #[redacted] weeks gestation #Small  [facility] #Trichomonas #BV - sent with metronidazole - counseled pt on cessation of sexual activity - sent ppx Diflucan prescription   Discharge from MAU in stable condition with strict/usual precautions Follow up at desired OBGYN as scheduled for ongoing prenatal care  Allergies as of 09/03/2023       Reactions   Ibuprofen Itching, Nausea And Vomiting        Medication List     STOP taking these medications    HYDROcodone -acetaminophen  5-325 MG tablet Commonly known as: NORCO/VICODIN   Moderna COVID-19 Vaccine 100 MCG/0.5ML injection Generic drug: COVID-19 mRNA vaccine (Moderna)   pseudoephedrine  60 MG tablet Commonly known as: SUDAFED       TAKE these medications    Blood Pressure Monitor Automat Devi 1 Device by Does not apply route daily. Automatic blood pressure cuff regular size. To monitor blood pressure regularly at home. ICD-10 code: O0.90.   cetirizine  10 MG tablet Commonly known as: ZyrTEC  Allergy Take 1 tablet (10 mg total) by mouth daily.   Comfort Fit Maternity Supp Sm Misc 1 Units by Does not apply route daily as needed.  famotidine  20 MG tablet Commonly known as: PEPCID  Take 1 tablet (20 mg total) by mouth daily. May increase to twice daily if needed.   ferrous gluconate  324 MG tablet Commonly known as: FERGON Take 1 tablet (324 mg total) by mouth every other day.   fluconazole 150 MG tablet Commonly known as: Diflucan Take 1 tablet (150 mg total) by mouth daily.   metroNIDAZOLE 500 MG tablet Commonly known as: FLAGYL Take 1 tablet (500 mg total) by mouth 2 (two) times daily.   ondansetron  4 MG disintegrating tablet Commonly known as: ZOFRAN -ODT Take 1 tablet (4 mg total) by mouth every 8 (eight) hours as needed for nausea or vomiting.   PrePLUS 27-1 MG Tabs Take 1 tablet by mouth daily. What changed: Another medication with the same name was added. Make sure you understand how and when to take each.   PrePLUS 27-1 MG Tabs Take 1  tablet by mouth daily. What changed: You were already taking a medication with the same name, and this prescription was added. Make sure you understand how and when to take each.   promethazine  6.25 MG/5ML syrup Commonly known as: PHENERGAN  Take 10 mLs (12.5 mg total) by mouth every 6 (six) hours as needed for nausea or vomiting.        Ebony Goldstein, MD 09/03/2023 3:49 PM

## 2023-09-03 NOTE — Discharge Instructions (Signed)

## 2023-09-04 LAB — GC/CHLAMYDIA PROBE AMP (~~LOC~~) NOT AT ARMC
Chlamydia: NEGATIVE
Comment: NEGATIVE
Comment: NORMAL
Neisseria Gonorrhea: NEGATIVE

## 2023-09-04 LAB — CULTURE, OB URINE
Culture: <10000 — AB
Special Requests: NORMAL

## 2023-09-09 ENCOUNTER — Telehealth

## 2023-09-09 ENCOUNTER — Encounter

## 2023-09-09 ENCOUNTER — Encounter: Payer: Self-pay | Admitting: Obstetrics and Gynecology

## 2023-09-09 DIAGNOSIS — A5901 Trichomonal vulvovaginitis: Secondary | ICD-10-CM | POA: Insufficient documentation

## 2023-09-11 ENCOUNTER — Other Ambulatory Visit: Payer: Self-pay | Admitting: Family Medicine

## 2023-09-25 LAB — OB RESULTS CONSOLE RUBELLA ANTIBODY, IGM: Rubella: IMMUNE

## 2023-09-25 LAB — HEPATITIS C ANTIBODY: HCV Ab: NEGATIVE

## 2023-09-25 LAB — OB RESULTS CONSOLE HIV ANTIBODY (ROUTINE TESTING): HIV: NONREACTIVE

## 2023-09-25 LAB — OB RESULTS CONSOLE HEPATITIS B SURFACE ANTIGEN: Hepatitis B Surface Ag: NEGATIVE

## 2024-01-27 LAB — OB RESULTS CONSOLE HIV ANTIBODY (ROUTINE TESTING): HIV: NONREACTIVE

## 2024-01-27 LAB — OB RESULTS CONSOLE RPR: RPR: NONREACTIVE

## 2024-04-03 LAB — OB RESULTS CONSOLE GBS: GBS: POSITIVE

## 2024-04-19 ENCOUNTER — Inpatient Hospital Stay (HOSPITAL_COMMUNITY)
Admission: AD | Admit: 2024-04-19 | Discharge: 2024-04-22 | Disposition: A | Source: Home / Self Care | Attending: Obstetrics and Gynecology | Admitting: Obstetrics and Gynecology

## 2024-04-19 ENCOUNTER — Encounter (HOSPITAL_COMMUNITY): Payer: Self-pay | Admitting: Obstetrics and Gynecology

## 2024-04-19 DIAGNOSIS — O4290 Premature rupture of membranes, unspecified as to length of time between rupture and onset of labor, unspecified weeks of gestation: Principal | ICD-10-CM | POA: Diagnosis present

## 2024-04-19 DIAGNOSIS — B951 Streptococcus, group B, as the cause of diseases classified elsewhere: Secondary | ICD-10-CM | POA: Diagnosis present

## 2024-04-19 LAB — CBC
HCT: 34.2 % — ABNORMAL LOW (ref 36.0–46.0)
Hemoglobin: 11.4 g/dL — ABNORMAL LOW (ref 12.0–15.0)
MCH: 30 pg (ref 26.0–34.0)
MCHC: 33.3 g/dL (ref 30.0–36.0)
MCV: 90 fL (ref 80.0–100.0)
Platelets: 229 10*3/uL (ref 150–400)
RBC: 3.8 MIL/uL — ABNORMAL LOW (ref 3.87–5.11)
RDW: 14.2 % (ref 11.5–15.5)
WBC: 12.6 10*3/uL — ABNORMAL HIGH (ref 4.0–10.5)
nRBC: 0 % (ref 0.0–0.2)

## 2024-04-19 LAB — TYPE AND SCREEN
ABO/RH(D): O POS
Antibody Screen: NEGATIVE

## 2024-04-19 LAB — POCT FERN TEST: POCT Fern Test: POSITIVE

## 2024-04-19 MED ORDER — LACTATED RINGERS IV SOLN: INTRAVENOUS | Status: DC

## 2024-04-19 MED ORDER — PENICILLIN G POT IN DEXTROSE 60000 UNIT/ML IV SOLN
3.0000 10*6.[IU] | INTRAVENOUS | Status: DC
  Administered 2024-04-20 (×2): 3 10*6.[IU] via INTRAVENOUS
  Filled 2024-04-19 (×2): qty 50

## 2024-04-19 MED ORDER — SODIUM CHLORIDE 0.9 % IV SOLN
5.0000 10*6.[IU] | Freq: Once | INTRAVENOUS | Status: AC
  Administered 2024-04-19: 5 10*6.[IU] via INTRAVENOUS
  Filled 2024-04-19: qty 5

## 2024-04-19 NOTE — H&P (Signed)
 Pam Jacobson is a 29 y.o. female presenting for PROM at 0500 ckear fluid.  She feels occ ctx. OB History     Gravida  2   Para  1   Term  1   Preterm      AB      Living         SAB      IAB      Ectopic      Multiple      Live Births             Past Medical History:  Diagnosis Date   Medical history non-contributory    Past Surgical History:  Procedure Laterality Date   NO PAST SURGERIES     Family History: family history includes Healthy in her father and mother. Social History:  reports that she has never smoked. She has never used smokeless tobacco. She reports that she does not drink alcohol and does not use drugs.     Maternal Diabetes: No Genetic Screening: Abnormal:  Results: Other: Maternal Ultrasounds/Referrals: Normal Fetal Ultrasounds or other Referrals:  None Maternal Substance Abuse:  No Significant Maternal Medications:  None Significant Maternal Lab Results:  Group B Strep positive Number of Prenatal Visits:greater than 3 verified prenatal visits Maternal Vaccinations:RSV: Given during pregnancy >/=14 days ago Other Comments:  None  Review of Systems History Physical Examination: General appearance - alert, well appearing, and in no distress Heart - normal rate and regular rhythm Abdomen - soft, nontender, nondistended, no masses or organomegaly gravud Extremities - Homan's sign NEG B  Dilation: 2 Effacement (%): 60 Station: -3 Exam by:: Perla A., RN Blood pressure 126/78, pulse 85, temperature 99.6 F (37.6 C), temperature source Oral, resp. rate 17, height 5' 4 (1.626 m), weight 83.1 kg, last menstrual period 07/17/2023, SpO2 100%, unknown if currently breastfeeding. Exam Physical Exam  Prenatal labs: ABO, Rh: --/--/O POS Performed at [facility], [address] [place], [place] [address]  [phone]) Antibody:   Rubella:   RPR:    HBsAg:    HIV:    GBS:     Assessment/Plan: PROM AT TERM LATENT  LABOR CAT 1 SMA CARRIER.  FOB NOT TESTED PCN G FOR POS GBS PT CAN HAVE IV PAIN MEDS AND EPIDURAL PRN   Maxmilian Trostel A Meiah Zamudio 04/19/2024, 10:14 PM

## 2024-04-19 NOTE — MAU Note (Signed)
..  NORETTA FRIER is a 29 y.o. at [redacted]w[redacted]d here in MAU reporting: leaking of clear fluid since 1830 that continues to come out. Reports contractions that started around 0500 and are now every 15 minutes.  Denies vaginal bleeding.   Pain score: 5/10 Vitals:   04/19/24 2141  BP: 111/65  Pulse: 88  Resp: 17  Temp: 99.6 F (37.6 C)  SpO2: 100%     FHT:145 Lab orders placed from triage:  mau labor

## 2024-04-20 ENCOUNTER — Inpatient Hospital Stay (HOSPITAL_COMMUNITY): Admitting: Anesthesiology

## 2024-04-20 ENCOUNTER — Encounter (HOSPITAL_COMMUNITY): Payer: Self-pay | Admitting: Obstetrics and Gynecology

## 2024-04-20 ENCOUNTER — Other Ambulatory Visit: Payer: Self-pay

## 2024-04-20 DIAGNOSIS — O4290 Premature rupture of membranes, unspecified as to length of time between rupture and onset of labor, unspecified weeks of gestation: Principal | ICD-10-CM | POA: Diagnosis present

## 2024-04-20 LAB — SYPHILIS: RPR W/REFLEX TO RPR TITER AND TREPONEMAL ANTIBODIES, TRADITIONAL SCREENING AND DIAGNOSIS ALGORITHM: RPR Ser Ql: NONREACTIVE

## 2024-04-20 MED ORDER — OXYTOCIN-SODIUM CHLORIDE 30-0.9 UT/500ML-% IV SOLN
1.0000 m[IU]/min | INTRAVENOUS | Status: DC
  Administered 2024-04-20: 2 m[IU]/min via INTRAVENOUS
  Filled 2024-04-20: qty 500

## 2024-04-20 MED ORDER — ACETAMINOPHEN 325 MG PO TABS
650.0000 mg | ORAL_TABLET | ORAL | Status: DC | PRN
  Administered 2024-04-21 (×2): 650 mg via ORAL
  Filled 2024-04-20 (×3): qty 2

## 2024-04-20 MED ORDER — SIMETHICONE 80 MG PO CHEW: 80.0000 mg | CHEWABLE_TABLET | ORAL | Status: DC | PRN

## 2024-04-20 MED ORDER — FENTANYL CITRATE (PF) 100 MCG/2ML IJ SOLN: 50.0000 ug | INTRAMUSCULAR | Status: DC | PRN

## 2024-04-20 MED ORDER — PHENYLEPHRINE 80 MCG/ML (10ML) SYRINGE FOR IV PUSH (FOR BLOOD PRESSURE SUPPORT): 80.0000 ug | PREFILLED_SYRINGE | INTRAVENOUS | Status: DC | PRN

## 2024-04-20 MED ORDER — TETANUS-DIPHTH-ACELL PERTUSSIS 5-2-15.5 LF-MCG/0.5 IM SUSP
0.5000 mL | Freq: Once | INTRAMUSCULAR | Status: DC
  Filled 2024-04-20: qty 0.5

## 2024-04-20 MED ORDER — LACTATED RINGERS IV SOLN: 500.0000 mL | INTRAVENOUS | Status: DC | PRN

## 2024-04-20 MED ORDER — LACTATED RINGERS IV SOLN: 500.0000 mL | Freq: Once | INTRAVENOUS | Status: DC

## 2024-04-20 MED ORDER — WITCH HAZEL-GLYCERIN EX PADS: 1.0000 | MEDICATED_PAD | CUTANEOUS | Status: DC | PRN

## 2024-04-20 MED ORDER — DIPHENHYDRAMINE HCL 50 MG/ML IJ SOLN: 12.5000 mg | INTRAMUSCULAR | Status: DC | PRN

## 2024-04-20 MED ORDER — ONDANSETRON HCL 4 MG/2ML IJ SOLN: 4.0000 mg | INTRAMUSCULAR | Status: DC | PRN

## 2024-04-20 MED ORDER — ACETAMINOPHEN 325 MG PO TABS: 650.0000 mg | ORAL_TABLET | ORAL | Status: DC | PRN

## 2024-04-20 MED ORDER — LIDOCAINE HCL (PF) 1 % IJ SOLN: 30.0000 mL | INTRAMUSCULAR | Status: DC | PRN

## 2024-04-20 MED ORDER — FENTANYL-BUPIVACAINE-NACL 0.5-0.125-0.9 MG/250ML-% EP SOLN: 12.0000 mL/h | EPIDURAL | Status: DC | PRN

## 2024-04-20 MED ORDER — LIDOCAINE HCL (PF) 1 % IJ SOLN
INTRAMUSCULAR | Status: DC | PRN
  Administered 2024-04-20: 8 mL via EPIDURAL

## 2024-04-20 MED ORDER — PRENATAL MULTIVITAMIN CH
1.0000 | ORAL_TABLET | Freq: Every day | ORAL | Status: DC
  Administered 2024-04-20 – 2024-04-22 (×3): 1 via ORAL
  Filled 2024-04-20 (×3): qty 1

## 2024-04-20 MED ORDER — BENZOCAINE-MENTHOL 20-0.5 % EX AERO
1.0000 | INHALATION_SPRAY | CUTANEOUS | Status: DC | PRN
  Administered 2024-04-20: 1 via TOPICAL
  Filled 2024-04-20: qty 56

## 2024-04-20 MED ORDER — DIBUCAINE (PERIANAL) 1 % EX OINT: 1.0000 | TOPICAL_OINTMENT | CUTANEOUS | Status: DC | PRN

## 2024-04-20 MED ORDER — ZOLPIDEM TARTRATE 5 MG PO TABS: 5.0000 mg | ORAL_TABLET | Freq: Every evening | ORAL | Status: DC | PRN

## 2024-04-20 MED ORDER — FENTANYL-BUPIVACAINE-NACL 0.5-0.125-0.9 MG/250ML-% EP SOLN
12.0000 mL/h | EPIDURAL | Status: DC | PRN
  Administered 2024-04-20: 12 mL/h via EPIDURAL
  Filled 2024-04-20: qty 250

## 2024-04-20 MED ORDER — OXYCODONE-ACETAMINOPHEN 5-325 MG PO TABS: 2.0000 | ORAL_TABLET | ORAL | Status: DC | PRN

## 2024-04-20 MED ORDER — COCONUT OIL OIL: 1.0000 | TOPICAL_OIL | Status: DC | PRN

## 2024-04-20 MED ORDER — EPHEDRINE 5 MG/ML INJ: 10.0000 mg | INTRAVENOUS | Status: DC | PRN

## 2024-04-20 MED ORDER — TERBUTALINE SULFATE 1 MG/ML IJ SOLN: 0.2500 mg | Freq: Once | INTRAMUSCULAR | Status: DC | PRN

## 2024-04-20 MED ORDER — DIPHENHYDRAMINE HCL 25 MG PO CAPS: 25.0000 mg | ORAL_CAPSULE | Freq: Four times a day (QID) | ORAL | Status: DC | PRN

## 2024-04-20 MED ORDER — OXYTOCIN BOLUS FROM INFUSION
333.0000 mL | Freq: Once | INTRAVENOUS | Status: DC
  Administered 2024-04-20: 333 mL via INTRAVENOUS

## 2024-04-20 MED ORDER — ONDANSETRON HCL 4 MG/2ML IJ SOLN: 4.0000 mg | Freq: Four times a day (QID) | INTRAMUSCULAR | Status: DC | PRN

## 2024-04-20 MED ORDER — OXYTOCIN-SODIUM CHLORIDE 30-0.9 UT/500ML-% IV SOLN: 2.5000 [IU]/h | INTRAVENOUS | Status: DC

## 2024-04-20 MED ORDER — OXYCODONE HCL 5 MG PO TABS: 10.0000 mg | ORAL_TABLET | ORAL | Status: DC | PRN

## 2024-04-20 MED ORDER — ONDANSETRON HCL 4 MG PO TABS: 4.0000 mg | ORAL_TABLET | ORAL | Status: DC | PRN

## 2024-04-20 MED ORDER — IBUPROFEN 600 MG PO TABS: 600.0000 mg | ORAL_TABLET | Freq: Four times a day (QID) | ORAL | Status: DC

## 2024-04-20 MED ORDER — SENNOSIDES-DOCUSATE SODIUM 8.6-50 MG PO TABS
2.0000 | ORAL_TABLET | Freq: Every day | ORAL | Status: DC
  Administered 2024-04-21 – 2024-04-22 (×2): 2 via ORAL
  Filled 2024-04-20 (×2): qty 2

## 2024-04-20 MED ORDER — OXYCODONE HCL 5 MG PO TABS: 5.0000 mg | ORAL_TABLET | ORAL | Status: DC | PRN

## 2024-04-20 MED ORDER — OXYCODONE-ACETAMINOPHEN 5-325 MG PO TABS: 1.0000 | ORAL_TABLET | ORAL | Status: DC | PRN

## 2024-04-20 MED ORDER — SOD CITRATE-CITRIC ACID 500-334 MG/5ML PO SOLN: 30.0000 mL | ORAL | Status: DC | PRN

## 2024-04-20 NOTE — Anesthesia Preprocedure Evaluation (Signed)
"                                    Anesthesia Evaluation  Patient identified by MRN, date of birth, ID band Patient awake    Reviewed: Allergy & Precautions, H&P , NPO status , Patient's Chart, lab work & pertinent test results, reviewed documented beta blocker date and time   Airway Mallampati: II  TM Distance: >3 FB Neck ROM: full    Dental no notable dental hx.    Pulmonary neg pulmonary ROS   Pulmonary exam normal breath sounds clear to auscultation       Cardiovascular negative cardio ROS Normal cardiovascular exam Rhythm:regular Rate:Normal     Neuro/Psych negative neurological ROS  negative psych ROS   GI/Hepatic negative GI ROS, Neg liver ROS,,,  Endo/Other  negative endocrine ROS    Renal/GU negative Renal ROS  negative genitourinary   Musculoskeletal   Abdominal   Peds  Hematology negative hematology ROS (+)   Anesthesia Other Findings   Reproductive/Obstetrics (+) Pregnancy                              Anesthesia Physical Anesthesia Plan  ASA: 2  Anesthesia Plan: Epidural   Post-op Pain Management: Minimal or no pain anticipated   Induction: Intravenous  PONV Risk Score and Plan: 2 and Ondansetron   Airway Management Planned: Natural Airway and Simple Face Mask  Additional Equipment: None  Intra-op Plan:   Post-operative Plan:   Informed Consent: I have reviewed the patients History and Physical, chart, labs and discussed the procedure including the risks, benefits and alternatives for the proposed anesthesia with the patient or authorized representative who has indicated his/her understanding and acceptance.       Plan Discussed with: Anesthesiologist and CRNA  Anesthesia Plan Comments:          Anesthesia Quick Evaluation  "

## 2024-04-20 NOTE — Anesthesia Procedure Notes (Signed)
 Epidural Patient location during procedure: OB Start time: 04/20/2024 8:48 AM End time: 04/20/2024 8:52 AM  Staffing Anesthesiologist: Mallory Manus, MD  Preanesthetic Checklist Completed: patient identified, IV checked, site marked, risks and benefits discussed, surgical consent, monitors and equipment checked, pre-op evaluation and timeout performed  Epidural Patient position: sitting Prep: DuraPrep and site prepped and draped Patient monitoring: continuous pulse ox and blood pressure Approach: midline Location: L3-L4 Injection technique: LOR air  Needle:  Needle type: Tuohy  Needle gauge: 17 G Needle length: 9 cm and 9 Needle insertion depth: 6 cm Catheter type: closed end flexible Catheter size: 19 Gauge Catheter at skin depth: 12 cm Test dose: negative  Assessment Events: blood not aspirated, no cerebrospinal fluid, injection not painful, no injection resistance, no paresthesia and negative IV test

## 2024-04-20 NOTE — Anesthesia Postprocedure Evaluation (Signed)
"   Anesthesia Post Note  Patient: Pam Jacobson  Procedure(s) Performed: AN AD HOC LABOR EPIDURAL     Patient location during evaluation: Mother Baby Anesthesia Type: Epidural Level of consciousness: awake and alert Pain management: pain level controlled Vital Signs Assessment: post-procedure vital signs reviewed and stable Respiratory status: spontaneous breathing, nonlabored ventilation and respiratory function stable Cardiovascular status: stable Postop Assessment: no headache, no backache and epidural receding Anesthetic complications: no   No notable events documented.  Last Vitals:  Vitals:   04/20/24 1002 04/20/24 1031  BP: 104/61 107/64  Pulse: 78 70  Resp:    Temp:    SpO2:      Last Pain:  Vitals:   04/20/24 1031  TempSrc:   PainSc: 0-No pain   Pain Goal: Patients Stated Pain Goal: 5 (04/20/24 0249)                 [name]      "

## 2024-04-21 LAB — CBC
HCT: 33.1 % — ABNORMAL LOW (ref 36.0–46.0)
Hemoglobin: 11.2 g/dL — ABNORMAL LOW (ref 12.0–15.0)
MCH: 30.2 pg (ref 26.0–34.0)
MCHC: 33.8 g/dL (ref 30.0–36.0)
MCV: 89.2 fL (ref 80.0–100.0)
Platelets: 206 10*3/uL (ref 150–400)
RBC: 3.71 MIL/uL — ABNORMAL LOW (ref 3.87–5.11)
RDW: 14.5 % (ref 11.5–15.5)
WBC: 14 10*3/uL — ABNORMAL HIGH (ref 4.0–10.5)
nRBC: 0 % (ref 0.0–0.2)

## 2024-04-21 NOTE — Lactation Note (Signed)
 This note was copied from a baby's chart. Lactation Consultation Note  Patient Name: Pam Jacobson Date: 04/21/2024 Age:28 hours  Initially MOB wanted to breast and bottle feed, but decided to formula feed infant only with her 2nd child. She did breastfeed her first child for 10 months.    Maternal Data    Feeding Nipple Type: Extra Slow Flow  LATCH Score                    Lactation Tools Discussed/Used    Interventions    Discharge    Consult Status      Pam Jacobson 04/21/2024, 6:04 PM

## 2024-04-21 NOTE — Progress Notes (Signed)
 PPD# 1 SVD w/  minor bilateral abrasions not requiring repair  Information for the patient's newborn:  Tacarra, Justo [[id number]]  female  Baby's Name [facility] Request circumcision before discharge: Yes   S:   Reports feeling good Tolerating PO fluid and solids No nausea or vomiting Bleeding is light Pain controlled with acetaminophen  and ibuprofen  (OTC) Up ad lib / ambulatory / voiding w/o difficulty Feeding: formula    O:   VS: BP 112/66 (BP Location: Right Arm)   Pulse 82   Temp 98.4 F (36.9 C) (Oral)   Resp 19   Ht 5' 4 (1.626 m)   Wt 83.1 kg   LMP 07/17/2023   SpO2 98%   Breastfeeding Unknown   BMI 31.45 kg/m   LABS:  Recent Labs    04/19/24 2200 04/21/24 0515  WBC 12.6* 14.0*  HGB 11.4* 11.2*  PLT 229 206   Blood type: --/--/O POS (02/01 2200) Rubella: Immune (07/09 0000)                      I&O: Intake/Output      02/02 0701 02/03 0700 02/03 0701 02/04 0700   Urine (mL/kg/hr) 0 (0)    Blood 100    Total Output 100    Net -100         Urine Occurrence 1 x      Physical Exam: Alert and oriented X3 Lungs: Clear and unlabored Heart: regular rate and rhythm / no mumurs Abdomen: soft, non-tender, non-distended  Fundus: firm, non-tender, U-2 Perineum: intact Lochia: appropriate Extremities: no edema, negative for calf pain, tenderness, or cords    A:  PPD # 1  Normal exam  P:  Routine postpartum orders Contraception: natural family planning Anticipate D/C on PP day 2  Mercer KATHEE Peal, DNP, CNM 04/21/2024, 1:03 PM

## 2024-04-21 NOTE — Plan of Care (Signed)

## 2024-04-22 DIAGNOSIS — B951 Streptococcus, group B, as the cause of diseases classified elsewhere: Secondary | ICD-10-CM | POA: Diagnosis present

## 2024-04-22 NOTE — Discharge Summary (Cosign Needed)
 OB Discharge Summary  Patient Name: Pam Jacobson DOB: [birth year] MRN: [id number]  Date of admission: 04/19/2024 Delivering provider: HENRY SLOUGH  Admitting diagnosis: PROM (premature rupture of membranes) [O42.90] Intrauterine pregnancy: [redacted]w[redacted]d     Secondary diagnosis: Patient Active Problem List   Diagnosis Date Noted   SVD (spontaneous vaginal delivery) 04/22/2024   Postpartum care following vaginal delivery 2/2 04/22/2024   Positive GBS test 04/22/2024   PROM (premature rupture of membranes) 04/20/2024    Date of discharge: 04/22/2024   Discharge diagnosis: Principal Problem:   Postpartum care following vaginal delivery 2/2 Active Problems:   PROM (premature rupture of membranes)   SVD (spontaneous vaginal delivery)   Positive GBS test                                                           Augmentation: Pitocin  Pain control: Epidural Laceration:None Complications: None  Hospital course:  Onset of Labor With Vaginal Delivery      29 y.o. yo [id number] at [redacted]w[redacted]d was admitted in Latent Labor on 04/19/2024.  Membrane Rupture Time/Date: 5:00 AM,04/19/2024  Delivery Method:Vaginal, Spontaneous Operative Delivery:N/A Episiotomy: None Lacerations:  None Patient had a postpartum course complicated by none. She is ambulating, tolerating a regular diet, passing flatus, and urinating well. Patient is discharged home in stable condition on 04/22/24.  Newborn Data: Birth date:04/20/2024 Birth time:11:20 AM Gender:Female Living status:Living Apgars:9 ,9  Weight:3080 g  Physical exam  Vitals:   04/21/24 0308 04/21/24 1537 04/21/24 2010 04/22/24 0513  BP: 112/66 116/78 109/62 107/65  Pulse: 82 71 66 73  Resp: 19 19 18 18   Temp: 98.4 F (36.9 C) 98 F (36.7 C) 98.3 F (36.8 C) 98.7 F (37.1 C)  TempSrc: Oral Oral Oral Oral  SpO2: 98% 97% 100% 100%  Weight:      Height:       General: alert and cooperative Lochia: appropriate Uterine Fundus: firm Perineum: intact DVT  Evaluation: No evidence of DVT seen on physical exam.  Labs: Lab Results  Component Value Date   WBC 14.0 (H) 04/21/2024   HGB 11.2 (L) 04/21/2024   HCT 33.1 (L) 04/21/2024   MCV 89.2 04/21/2024   PLT 206 04/21/2024      04/21/2024    2:05 PM 04/20/2024   12:58 PM  Edinburgh Postnatal Depression Scale Screening Tool  I have been able to laugh and see the funny side of things. 0 0  I have looked forward with enjoyment to things. 0 0  I have blamed myself unnecessarily when things went wrong. 0 0  I have been anxious or worried for no good reason. 0 0  I have felt scared or panicky for no good reason. 0 0  Things have been getting on top of me. 0 0  I have been so unhappy that I have had difficulty sleeping. 0 0  I have felt sad or miserable. 0 0  I have been so unhappy that I have been crying. 0 0  The thought of harming myself has occurred to me. 0 0  Edinburgh Postnatal Depression Scale Total 0 0   Discharge instructions:  per After Visit Summary  After Visit Meds:  Allergies as of 04/22/2024       Reactions   Ibuprofen  Itching, Nausea And Vomiting  Medication List     STOP taking these medications    Blood Pressure Monitor Automat Devi   cetirizine  10 MG tablet Commonly known as: ZyrTEC  Allergy   Comfort Fit Maternity Supp Sm Misc   famotidine  20 MG tablet Commonly known as: PEPCID    ferrous gluconate  324 MG tablet Commonly known as: FERGON   fluconazole  150 MG tablet Commonly known as: Diflucan    metroNIDAZOLE  500 MG tablet Commonly known as: FLAGYL    ondansetron  4 MG disintegrating tablet Commonly known as: ZOFRAN -ODT   promethazine  6.25 MG/5ML syrup Commonly known as: PHENERGAN        TAKE these medications    PrePLUS 27-1 MG Tabs Take 1 tablet by mouth daily. What changed: Another medication with the same name was removed. Continue taking this medication, and follow the directions you see here.       Activity: Advance as  tolerated. Pelvic rest for 6 weeks.   Newborn Data: Live born female  Birth Weight: 6 lb 12.6 oz (3080 g) APGAR: 9, 9  Newborn Delivery   Birth date/time: 04/20/2024 11:20:00 Delivery type: Vaginal, Spontaneous    Baby Feeding: Bottle Disposition:home with mother  Delivery Report:  Review the Delivery Report for details.    Follow up:  Follow-up Information     [facility]. Schedule an appointment as soon as possible for a visit in 6 week(s).   Specialty: Obstetrics and Gynecology Contact information: [address] [place]  [address] [phone]               Alan MARLA Molt, CNM, MSN 04/22/2024, 11:18 AM
